# Patient Record
Sex: Female | Born: 1935
Health system: Southern US, Community
[De-identification: ages and names within clinical notes are randomized; demographics above are authoritative.]

## PROBLEM LIST (undated history)

## (undated) DIAGNOSIS — M1990 Unspecified osteoarthritis, unspecified site: Secondary | ICD-10-CM

## (undated) DIAGNOSIS — E559 Vitamin D deficiency, unspecified: Secondary | ICD-10-CM

## (undated) DIAGNOSIS — F419 Anxiety disorder, unspecified: Secondary | ICD-10-CM

## (undated) DIAGNOSIS — K219 Gastro-esophageal reflux disease without esophagitis: Secondary | ICD-10-CM

## (undated) DIAGNOSIS — I82409 Acute embolism and thrombosis of unspecified deep veins of unspecified lower extremity: Secondary | ICD-10-CM

## (undated) DIAGNOSIS — IMO0001 Reserved for inherently not codable concepts without codable children: Secondary | ICD-10-CM

## (undated) HISTORY — DX: Acute embolism and thrombosis of unspecified deep veins of unspecified lower extremity: I82.409

## (undated) HISTORY — DX: Vitamin D deficiency, unspecified: E55.9

## (undated) HISTORY — DX: Anxiety disorder, unspecified: F41.9

## (undated) HISTORY — PX: FACIAL COSMETIC SURGERY: SHX629

## (undated) HISTORY — DX: Reserved for inherently not codable concepts without codable children: IMO0001

## (undated) HISTORY — DX: Unspecified osteoarthritis, unspecified site: M19.90

## (undated) HISTORY — DX: Gastro-esophageal reflux disease without esophagitis: K21.9

---

## 2012-08-30 ENCOUNTER — Encounter: Payer: Self-pay | Admitting: *Deleted

## 2012-08-30 DIAGNOSIS — M129 Arthropathy, unspecified: Secondary | ICD-10-CM | POA: Insufficient documentation

## 2012-08-30 DIAGNOSIS — K219 Gastro-esophageal reflux disease without esophagitis: Secondary | ICD-10-CM | POA: Insufficient documentation

## 2012-08-30 DIAGNOSIS — R5381 Other malaise: Secondary | ICD-10-CM | POA: Insufficient documentation

## 2012-08-30 DIAGNOSIS — I739 Peripheral vascular disease, unspecified: Secondary | ICD-10-CM | POA: Insufficient documentation

## 2012-08-30 DIAGNOSIS — R5383 Other fatigue: Secondary | ICD-10-CM

## 2012-08-30 DIAGNOSIS — R030 Elevated blood-pressure reading, without diagnosis of hypertension: Secondary | ICD-10-CM

## 2012-08-30 DIAGNOSIS — F411 Generalized anxiety disorder: Secondary | ICD-10-CM

## 2012-08-30 DIAGNOSIS — E538 Deficiency of other specified B group vitamins: Secondary | ICD-10-CM

## 2012-08-30 DIAGNOSIS — H9190 Unspecified hearing loss, unspecified ear: Secondary | ICD-10-CM | POA: Insufficient documentation

## 2012-08-30 DIAGNOSIS — IMO0001 Reserved for inherently not codable concepts without codable children: Secondary | ICD-10-CM | POA: Insufficient documentation

## 2012-08-31 ENCOUNTER — Encounter: Payer: Self-pay | Admitting: Cardiovascular Disease

## 2012-08-31 ENCOUNTER — Encounter: Payer: Self-pay | Admitting: *Deleted

## 2012-08-31 ENCOUNTER — Ambulatory Visit (INDEPENDENT_AMBULATORY_CARE_PROVIDER_SITE_OTHER): Payer: Medicare Other | Admitting: Cardiovascular Disease

## 2012-08-31 VITALS — BP 154/88 | HR 78 | Ht 63.0 in | Wt 183.5 lb

## 2012-08-31 DIAGNOSIS — R0602 Shortness of breath: Secondary | ICD-10-CM

## 2012-08-31 DIAGNOSIS — I10 Essential (primary) hypertension: Secondary | ICD-10-CM

## 2012-08-31 NOTE — Progress Notes (Signed)
Patient ID: Amy Atkins, female   DOB: May 26, 1931, 79 y.o.   MRN: [id number]    CARDIOLOGY CONSULT NOTE  Patient ID: Amy Atkins MRN: [id number] DOB/AGE: 1931/11/17 79 y.o.  Admit date: (Not on file) Primary Physician: Damien Fusi, MD  Reason for Consultation: shortness of breath  HPI:  Mrs. Clermont purportedly has a h/o GERD, anxiety, and white coat HTN, and presents today for the evaluation of shortness of breath. She denies the aforementioned h/o GERD and essential HTN. Her BP runs 120/60 mmHg at home, as she checks it 2-3 times weekly. She does have varicose veins and has an appointment next week to see a vein specialist.  Her husband passed away 1.5 yrs ago and she was his caregiver. They had been married for 52 years. While she was doing this, she began to eat a lot for comfort and put on considerable weight. She rarely left the home during this time.  She's been dieting and exercising for the past 2 months and has lost about 15 lbs. Since that time, her shortness of breath has improved. She's also now going to physical therapy.  She denies chest pain, palpitations, dizziness, and syncope. She used to feel exhausted while being her husband's caregiver, but now her energy levels have improved.  She takes no medications.   Review of systems complete and found to be negative unless listed above in HPI  Past Medical History: see HPI  SocHx: her husband passed away 1.5 yrs ago and she was his caregiver. They had been married for 52 years. She has 1 daughter and son-in-law who live with her, since her husband's passing. She is a Scientist, product/process development.   Family History  Problem Relation Age of Onset  . Heart disease Mother   . Hypertension Mother   . Heart disease Father   . Hypertension Father   . Stroke Sister   . Heart disease Brother   . Seizures Brother     History   Social History  . Marital Status: Widowed    Spouse Name: N/A    Number of Children: N/A  .  Years of Education: N/A   Occupational History  . Not on file.   Social History Main Topics  . Smoking status: Never Smoker   . Smokeless tobacco: Not on file  . Alcohol Use: Not on file  . Drug Use: Not on file  . Sexually Active: Not on file   Other Topics Concern  . Not on file   Social History Narrative  . No narrative on file      (Not in a hospital admission)  Physical exam Blood pressure 154/88, pulse 78, height 5\' 3"  (1.6 m), weight 183 lb 8 oz (83.235 kg). General: NAD Neck: No JVD, no thyromegaly or thyroid nodule.  Lungs: Clear to auscultation bilaterally with normal respiratory effort. CV: Nondisplaced PMI.  Heart regular S1/S2, no S3/S4, no murmur.  No peripheral edema.  No carotid bruit.  Normal pedal pulses.  Abdomen: Soft, nontender, no hepatosplenomegaly, no distention.  Skin: Intact without lesions or rashes.  Neurologic: Alert and oriented x 3.  Psych: Normal affect. Extremities: No clubbing or cyanosis. Varicosities in both legs. HEENT: Normal.   Labs:   No results found for this basename: WBC, HGB, HCT, MCV, PLT   No results found for this basename: NA, K, CL, CO2, BUN, CREATININE, CALCIUM, LABALBU, PROT, BILITOT, ALKPHOS, ALT, AST, GLUCOSE,  in the last 168 hours No results found for this basename: CKTOTAL, CKMB,  CKMBINDEX, TROPONINI    No results found for this basename: CHOL   No results found for this basename: HDL   No results found for this basename: LDLCALC   No results found for this basename: TRIG   No results found for this basename: CHOLHDL   No results found for this basename: LDLDIRECT       EKG: Sinus rhythm, rate 75 bpm, with an incomplete RBBB and an LAFB     ASSESSMENT AND PLAN:  1. Shortness of breath: based on her history, this may simply be due to cardiovascular deconditioning given the fact she was confined to her home as her husband's caregiver for so long. To be sure that we're not overlooking occult ischemic  heart disease, I will proceed with both an echocardiogram to evaluate for structural heart disease, as well as a stress echocardiogram to evaluate for inducible ischemia.  Signed: Prentice Docker, M.D., F.A.C.C. 08/31/2012, 9:25 AM

## 2012-08-31 NOTE — Patient Instructions (Addendum)
Your physician recommends that you schedule a follow-up appointment in: 4-6 weeks  Your physician has requested that you have an echocardiogram. Echocardiography is a painless test that uses sound waves to create images of your heart. It provides your doctor with information about the size and shape of your heart and how well your heart's chambers and valves are working. This procedure takes approximately one hour. There are no restrictions for this procedure.WE WILL CALL YOU WITH THE RESULTS ONCE AVAILABLE   Your physician has requested that you have a stress echocardiogram. For further information please visit [email or web address] Please follow instruction sheet as given.

## 2012-09-01 ENCOUNTER — Encounter: Payer: Self-pay | Admitting: Cardiovascular Disease

## 2012-09-23 ENCOUNTER — Other Ambulatory Visit (HOSPITAL_COMMUNITY): Payer: Medicare Other

## 2012-10-01 ENCOUNTER — Encounter (HOSPITAL_COMMUNITY): Payer: Self-pay

## 2012-10-01 ENCOUNTER — Ambulatory Visit (HOSPITAL_COMMUNITY)
Admission: RE | Admit: 2012-10-01 | Discharge: 2012-10-01 | Disposition: A | Discharge status: Home / Self Care | Payer: Medicare Other | Source: Ambulatory Visit | Attending: Cardiovascular Disease | Admitting: Cardiovascular Disease

## 2012-10-01 DIAGNOSIS — R0602 Shortness of breath: Secondary | ICD-10-CM

## 2012-10-01 DIAGNOSIS — R0609 Other forms of dyspnea: Secondary | ICD-10-CM | POA: Insufficient documentation

## 2012-10-01 DIAGNOSIS — R0989 Other specified symptoms and signs involving the circulatory and respiratory systems: Secondary | ICD-10-CM | POA: Insufficient documentation

## 2012-10-01 DIAGNOSIS — I517 Cardiomegaly: Secondary | ICD-10-CM

## 2012-10-01 DIAGNOSIS — I10 Essential (primary) hypertension: Secondary | ICD-10-CM

## 2012-10-01 NOTE — Progress Notes (Signed)
*  PRELIMINARY RESULTS* Echocardiogram 2D Echocardiogram has been performed.  Amy Atkins 10/01/2012, 9:46 AM

## 2012-10-01 NOTE — Progress Notes (Signed)
Stress Lab Nurses Notes - [facility]  Ardella Hand 10/01/2012  Reason for doing test: Dyspnea  Type of test: Stress Echo  Nurse performing test: Marlena Clipper, RN  Nuclear Medicine Tech: Not Applicable  Echo Tech: Karrie Doffing  MD performing test: [place]/ Joni Reining NP  Family MD:   Test explained and consent signed: yes  IV started: No IV started  Symptoms: hr reached slight tiredness in legs  Treatment/Intervention: None  Reason test stopped: reached target HR  After recovery IV was: No IV started  Patient to return to Nuc. Med at :NA  Patient discharged: Home  Patient's Condition upon discharge was: stable  Comments: Patient walked 5 minutes and 05 seconds in bruce protocol. Resting HR was 91 and resting BP was 184/64 and her peak HR was 139 and peak BP was 221/82. Symptoms resolved in recovery.  Angelica Pou

## 2012-10-01 NOTE — Progress Notes (Signed)
*  PRELIMINARY RESULTS* Echocardiogram Echocardiogram Stress Test has been performed.  Amy Atkins  10/01/2012, 10:46 AM

## 2012-10-05 ENCOUNTER — Encounter: Payer: Self-pay | Admitting: Cardiology

## 2012-10-05 ENCOUNTER — Ambulatory Visit (INDEPENDENT_AMBULATORY_CARE_PROVIDER_SITE_OTHER): Payer: Medicare Other | Admitting: Cardiology

## 2012-10-05 VITALS — BP 141/78 | HR 86 | Ht 63.0 in | Wt 182.1 lb

## 2012-10-05 DIAGNOSIS — R0602 Shortness of breath: Secondary | ICD-10-CM | POA: Insufficient documentation

## 2012-10-05 DIAGNOSIS — IMO0001 Reserved for inherently not codable concepts without codable children: Secondary | ICD-10-CM

## 2012-10-05 DIAGNOSIS — I10 Essential (primary) hypertension: Secondary | ICD-10-CM

## 2012-10-05 NOTE — Assessment & Plan Note (Signed)
Recent testing shows normal LV function and no evidence of ischemia. Reassuring overall. No further cardiac testing anticipated this time. I have recommended that she continue to stay active with exercise. Keep followup with primary care.

## 2012-10-05 NOTE — Progress Notes (Signed)
   Clinical Summary Ms. Borland is a 79 y.o.female presenting for office visit.  She was just recently seen by Dr. Purvis Sheffield in late July. I reviewed the previous office note. She requested to come back and see me in followup, I used to take care of her husband.  Recent exercise echocardiogram was negative for ischemia. Echocardiogram from late August showed mild LVH with LVEF 60-65%, grade 1 diastolic dysfunction, MAC with trace mitral regurgitation, trace tricuspid regurgitation. We reviewed these results today.  She states she has been trying to exercise with a treadmill, does feel better in general. Also stays active with her church. She tells me that she will be having several teeth extracted. No other major health changes. We went over her home blood pressure checks, most of which look quite good. She had one elevation with systolics in the 160s.   Allergies  Allergen Reactions  . Terbinafine And Related     Itching, rash    Current Outpatient Prescriptions  Medication Sig Dispense Refill  . Cholecalciferol (VITAMIN D PO) Take by mouth daily.      Marland Kitchen GARLIC PO Take by mouth daily.      . ST JOHNS WORT PO Take by mouth daily.       No current facility-administered medications for this visit.    Past Medical History  Diagnosis Date  . White coat hypertension   . Anxiety   . GERD (gastroesophageal reflux disease)     Social History Ms. Peragine reports that she has never smoked. She does not have any smokeless tobacco history on file. Ms. Hollinshead has no alcohol history on file.  Review of Systems No palpitations, dizziness, syncope.  Physical Examination Filed Vitals:   10/05/12 1521  BP: 141/78  Pulse: 86   Filed Weights   10/05/12 1521  Weight: 182 lb 1.3 oz (82.591 kg)   Appears comfortable at rest. HEENT: Conjunctiva and lids normal, oropharynx clear. Neck: Supple, no elevated JVP or carotid bruits, no thyromegaly. Lungs: Clear to auscultation, nonlabored  breathing at rest. Cardiac: Regular rate and rhythm, no S3 or significant systolic murmur, no pericardial rub. Abdomen: Soft, nontender, nbowel sounds present. Extremities: No pitting edema, distal pulses 2+.   Problem List and Plan   Shortness of breath Recent testing shows normal LV function and no evidence of ischemia. Reassuring overall. No further cardiac testing anticipated this time. I have recommended that she continue to stay active with exercise. Keep followup with primary care.  White coat hypertension Most blood pressure checks look good by home monitor. I asked her to keep an eye on this with Dr. Lissa Morales. No medications being added.    Jonelle Sidle, M.D., F.A.C.C.

## 2012-10-05 NOTE — Patient Instructions (Addendum)
Your physician recommends that you schedule a follow-up appointment in: AS NEEDED  

## 2012-10-05 NOTE — Assessment & Plan Note (Addendum)
Most blood pressure checks look good by home monitor. I asked her to keep an eye on this with Dr. Lissa Morales. No medications being added.

## 2012-10-06 ENCOUNTER — Ambulatory Visit: Payer: Medicare Other | Admitting: Cardiovascular Disease

## 2015-07-19 DIAGNOSIS — H35032 Hypertensive retinopathy, left eye: Secondary | ICD-10-CM | POA: Diagnosis not present

## 2015-07-19 DIAGNOSIS — H353112 Nonexudative age-related macular degeneration, right eye, intermediate dry stage: Secondary | ICD-10-CM | POA: Diagnosis not present

## 2015-07-19 DIAGNOSIS — H2512 Age-related nuclear cataract, left eye: Secondary | ICD-10-CM | POA: Diagnosis not present

## 2015-07-19 DIAGNOSIS — H40013 Open angle with borderline findings, low risk, bilateral: Secondary | ICD-10-CM | POA: Diagnosis not present

## 2015-07-19 DIAGNOSIS — H25012 Cortical age-related cataract, left eye: Secondary | ICD-10-CM | POA: Diagnosis not present

## 2015-07-19 DIAGNOSIS — H35031 Hypertensive retinopathy, right eye: Secondary | ICD-10-CM | POA: Diagnosis not present

## 2015-07-27 DIAGNOSIS — H2512 Age-related nuclear cataract, left eye: Secondary | ICD-10-CM | POA: Diagnosis not present

## 2015-07-27 DIAGNOSIS — H25012 Cortical age-related cataract, left eye: Secondary | ICD-10-CM | POA: Diagnosis not present

## 2015-11-07 DIAGNOSIS — H04123 Dry eye syndrome of bilateral lacrimal glands: Secondary | ICD-10-CM | POA: Diagnosis not present

## 2015-11-07 DIAGNOSIS — H40013 Open angle with borderline findings, low risk, bilateral: Secondary | ICD-10-CM | POA: Diagnosis not present

## 2016-01-25 ENCOUNTER — Telehealth: Payer: Self-pay

## 2016-01-25 NOTE — Telephone Encounter (Signed)
Pt had molar tooth pulled due to infection.Now has sinus pain,cough,feels feverish. I encouraged her to see dentist who pulled tooth but [place] She said she would call them and see if they would prescribe antibiotic.She was concerned BP was elevate but she is in a good deal of pain,takes no medications other than OTC products

## 2016-02-13 DIAGNOSIS — H40013 Open angle with borderline findings, low risk, bilateral: Secondary | ICD-10-CM | POA: Diagnosis not present

## 2016-08-11 DIAGNOSIS — H2513 Age-related nuclear cataract, bilateral: Secondary | ICD-10-CM | POA: Diagnosis not present

## 2016-08-11 DIAGNOSIS — H25012 Cortical age-related cataract, left eye: Secondary | ICD-10-CM | POA: Diagnosis not present

## 2016-08-11 DIAGNOSIS — H40013 Open angle with borderline findings, low risk, bilateral: Secondary | ICD-10-CM | POA: Diagnosis not present

## 2016-08-11 DIAGNOSIS — H35033 Hypertensive retinopathy, bilateral: Secondary | ICD-10-CM | POA: Diagnosis not present

## 2016-08-11 DIAGNOSIS — H2512 Age-related nuclear cataract, left eye: Secondary | ICD-10-CM | POA: Diagnosis not present

## 2016-08-11 DIAGNOSIS — H25013 Cortical age-related cataract, bilateral: Secondary | ICD-10-CM | POA: Diagnosis not present

## 2016-09-02 DIAGNOSIS — R5383 Other fatigue: Secondary | ICD-10-CM | POA: Diagnosis not present

## 2016-09-02 DIAGNOSIS — E559 Vitamin D deficiency, unspecified: Secondary | ICD-10-CM | POA: Diagnosis not present

## 2016-09-02 DIAGNOSIS — E669 Obesity, unspecified: Secondary | ICD-10-CM | POA: Diagnosis not present

## 2016-09-02 DIAGNOSIS — M545 Low back pain: Secondary | ICD-10-CM | POA: Diagnosis not present

## 2016-09-11 ENCOUNTER — Other Ambulatory Visit (HOSPITAL_COMMUNITY): Payer: Self-pay | Admitting: Internal Medicine

## 2016-09-11 ENCOUNTER — Ambulatory Visit (HOSPITAL_COMMUNITY)
Admission: RE | Admit: 2016-09-11 | Discharge: 2016-09-11 | Disposition: A | Discharge status: Home / Self Care | Payer: Medicare Other | Source: Ambulatory Visit | Attending: Internal Medicine | Admitting: Internal Medicine

## 2016-09-11 DIAGNOSIS — R52 Pain, unspecified: Secondary | ICD-10-CM

## 2016-09-11 DIAGNOSIS — I10 Essential (primary) hypertension: Secondary | ICD-10-CM | POA: Diagnosis not present

## 2016-09-11 DIAGNOSIS — E559 Vitamin D deficiency, unspecified: Secondary | ICD-10-CM | POA: Diagnosis not present

## 2016-09-11 DIAGNOSIS — M8938 Hypertrophy of bone, other site: Secondary | ICD-10-CM | POA: Insufficient documentation

## 2016-09-11 DIAGNOSIS — M545 Low back pain: Secondary | ICD-10-CM | POA: Insufficient documentation

## 2016-09-11 DIAGNOSIS — M5136 Other intervertebral disc degeneration, lumbar region: Secondary | ICD-10-CM | POA: Diagnosis not present

## 2016-09-11 DIAGNOSIS — M5135 Other intervertebral disc degeneration, thoracolumbar region: Secondary | ICD-10-CM | POA: Diagnosis not present

## 2016-09-11 DIAGNOSIS — M79606 Pain in leg, unspecified: Secondary | ICD-10-CM | POA: Diagnosis not present

## 2016-09-11 DIAGNOSIS — M79605 Pain in left leg: Secondary | ICD-10-CM

## 2016-09-15 DIAGNOSIS — Z6833 Body mass index (BMI) 33.0-33.9, adult: Secondary | ICD-10-CM | POA: Diagnosis not present

## 2016-09-17 ENCOUNTER — Ambulatory Visit (HOSPITAL_COMMUNITY)
Admission: RE | Admit: 2016-09-17 | Discharge: 2016-09-17 | Disposition: A | Discharge status: Home / Self Care | Payer: Medicare Other | Source: Ambulatory Visit | Attending: Internal Medicine | Admitting: Internal Medicine

## 2016-09-17 DIAGNOSIS — M7989 Other specified soft tissue disorders: Secondary | ICD-10-CM | POA: Diagnosis not present

## 2016-09-17 DIAGNOSIS — M79662 Pain in left lower leg: Secondary | ICD-10-CM | POA: Diagnosis not present

## 2016-09-17 DIAGNOSIS — M79605 Pain in left leg: Secondary | ICD-10-CM | POA: Diagnosis not present

## 2016-09-22 DIAGNOSIS — Z6832 Body mass index (BMI) 32.0-32.9, adult: Secondary | ICD-10-CM | POA: Diagnosis not present

## 2016-09-25 DIAGNOSIS — I10 Essential (primary) hypertension: Secondary | ICD-10-CM | POA: Diagnosis not present

## 2016-09-30 DIAGNOSIS — H25812 Combined forms of age-related cataract, left eye: Secondary | ICD-10-CM | POA: Diagnosis not present

## 2016-09-30 DIAGNOSIS — H2512 Age-related nuclear cataract, left eye: Secondary | ICD-10-CM | POA: Diagnosis not present

## 2016-10-09 DIAGNOSIS — I10 Essential (primary) hypertension: Secondary | ICD-10-CM | POA: Diagnosis not present

## 2016-10-16 DIAGNOSIS — H2511 Age-related nuclear cataract, right eye: Secondary | ICD-10-CM | POA: Diagnosis not present

## 2016-10-16 DIAGNOSIS — H25011 Cortical age-related cataract, right eye: Secondary | ICD-10-CM | POA: Diagnosis not present

## 2016-10-28 DIAGNOSIS — H25011 Cortical age-related cataract, right eye: Secondary | ICD-10-CM | POA: Diagnosis not present

## 2016-10-28 DIAGNOSIS — H2511 Age-related nuclear cataract, right eye: Secondary | ICD-10-CM | POA: Diagnosis not present

## 2016-11-10 DIAGNOSIS — I10 Essential (primary) hypertension: Secondary | ICD-10-CM | POA: Diagnosis not present

## 2016-12-01 DIAGNOSIS — Z6833 Body mass index (BMI) 33.0-33.9, adult: Secondary | ICD-10-CM | POA: Diagnosis not present

## 2016-12-01 DIAGNOSIS — I10 Essential (primary) hypertension: Secondary | ICD-10-CM | POA: Diagnosis not present

## 2016-12-01 DIAGNOSIS — E559 Vitamin D deficiency, unspecified: Secondary | ICD-10-CM | POA: Diagnosis not present

## 2017-01-22 DIAGNOSIS — I10 Essential (primary) hypertension: Secondary | ICD-10-CM | POA: Diagnosis not present

## 2017-01-22 DIAGNOSIS — E559 Vitamin D deficiency, unspecified: Secondary | ICD-10-CM | POA: Diagnosis not present

## 2017-03-12 DIAGNOSIS — H04123 Dry eye syndrome of bilateral lacrimal glands: Secondary | ICD-10-CM | POA: Diagnosis not present

## 2017-03-12 DIAGNOSIS — H40043 Steroid responder, bilateral: Secondary | ICD-10-CM | POA: Diagnosis not present

## 2017-03-12 DIAGNOSIS — H40013 Open angle with borderline findings, low risk, bilateral: Secondary | ICD-10-CM | POA: Diagnosis not present

## 2017-03-12 DIAGNOSIS — H02422 Myogenic ptosis of left eyelid: Secondary | ICD-10-CM | POA: Diagnosis not present

## 2017-04-30 DIAGNOSIS — E559 Vitamin D deficiency, unspecified: Secondary | ICD-10-CM | POA: Diagnosis not present

## 2017-04-30 DIAGNOSIS — I10 Essential (primary) hypertension: Secondary | ICD-10-CM | POA: Diagnosis not present

## 2017-04-30 DIAGNOSIS — I8390 Asymptomatic varicose veins of unspecified lower extremity: Secondary | ICD-10-CM | POA: Diagnosis not present

## 2017-04-30 DIAGNOSIS — R5383 Other fatigue: Secondary | ICD-10-CM | POA: Diagnosis not present

## 2017-04-30 DIAGNOSIS — E669 Obesity, unspecified: Secondary | ICD-10-CM | POA: Diagnosis not present

## 2017-05-13 DIAGNOSIS — H35033 Hypertensive retinopathy, bilateral: Secondary | ICD-10-CM | POA: Diagnosis not present

## 2017-05-13 DIAGNOSIS — H26493 Other secondary cataract, bilateral: Secondary | ICD-10-CM | POA: Diagnosis not present

## 2017-05-13 DIAGNOSIS — H353112 Nonexudative age-related macular degeneration, right eye, intermediate dry stage: Secondary | ICD-10-CM | POA: Diagnosis not present

## 2017-05-13 DIAGNOSIS — H40013 Open angle with borderline findings, low risk, bilateral: Secondary | ICD-10-CM | POA: Diagnosis not present

## 2017-07-01 DIAGNOSIS — I8311 Varicose veins of right lower extremity with inflammation: Secondary | ICD-10-CM | POA: Diagnosis not present

## 2017-07-01 DIAGNOSIS — I8312 Varicose veins of left lower extremity with inflammation: Secondary | ICD-10-CM | POA: Diagnosis not present

## 2017-07-02 DIAGNOSIS — I10 Essential (primary) hypertension: Secondary | ICD-10-CM | POA: Diagnosis not present

## 2017-07-02 DIAGNOSIS — I8390 Asymptomatic varicose veins of unspecified lower extremity: Secondary | ICD-10-CM | POA: Diagnosis not present

## 2017-08-20 DIAGNOSIS — I8312 Varicose veins of left lower extremity with inflammation: Secondary | ICD-10-CM | POA: Diagnosis not present

## 2017-08-26 DIAGNOSIS — I8311 Varicose veins of right lower extremity with inflammation: Secondary | ICD-10-CM | POA: Diagnosis not present

## 2017-09-16 DIAGNOSIS — R600 Localized edema: Secondary | ICD-10-CM | POA: Diagnosis not present

## 2017-09-16 DIAGNOSIS — I8312 Varicose veins of left lower extremity with inflammation: Secondary | ICD-10-CM | POA: Diagnosis not present

## 2017-09-16 DIAGNOSIS — I8311 Varicose veins of right lower extremity with inflammation: Secondary | ICD-10-CM | POA: Diagnosis not present

## 2017-10-13 DIAGNOSIS — I10 Essential (primary) hypertension: Secondary | ICD-10-CM | POA: Diagnosis not present

## 2017-10-13 DIAGNOSIS — E669 Obesity, unspecified: Secondary | ICD-10-CM | POA: Diagnosis not present

## 2017-10-13 DIAGNOSIS — M25579 Pain in unspecified ankle and joints of unspecified foot: Secondary | ICD-10-CM | POA: Diagnosis not present

## 2017-11-13 DIAGNOSIS — I8312 Varicose veins of left lower extremity with inflammation: Secondary | ICD-10-CM | POA: Diagnosis not present

## 2017-12-08 DIAGNOSIS — R0683 Snoring: Secondary | ICD-10-CM | POA: Diagnosis not present

## 2017-12-08 DIAGNOSIS — I10 Essential (primary) hypertension: Secondary | ICD-10-CM | POA: Diagnosis not present

## 2017-12-08 DIAGNOSIS — R399 Unspecified symptoms and signs involving the genitourinary system: Secondary | ICD-10-CM | POA: Diagnosis not present

## 2017-12-08 DIAGNOSIS — R0600 Dyspnea, unspecified: Secondary | ICD-10-CM | POA: Diagnosis not present

## 2017-12-16 DIAGNOSIS — I8312 Varicose veins of left lower extremity with inflammation: Secondary | ICD-10-CM | POA: Diagnosis not present

## 2017-12-18 DIAGNOSIS — I8312 Varicose veins of left lower extremity with inflammation: Secondary | ICD-10-CM | POA: Diagnosis not present

## 2017-12-22 DIAGNOSIS — I8312 Varicose veins of left lower extremity with inflammation: Secondary | ICD-10-CM | POA: Diagnosis not present

## 2018-01-05 DIAGNOSIS — I8312 Varicose veins of left lower extremity with inflammation: Secondary | ICD-10-CM | POA: Diagnosis not present

## 2018-01-14 DIAGNOSIS — M79606 Pain in leg, unspecified: Secondary | ICD-10-CM | POA: Diagnosis not present

## 2018-01-14 DIAGNOSIS — R030 Elevated blood-pressure reading, without diagnosis of hypertension: Secondary | ICD-10-CM | POA: Diagnosis not present

## 2018-01-14 DIAGNOSIS — R399 Unspecified symptoms and signs involving the genitourinary system: Secondary | ICD-10-CM | POA: Diagnosis not present

## 2018-01-14 DIAGNOSIS — R0683 Snoring: Secondary | ICD-10-CM | POA: Diagnosis not present

## 2018-01-20 DIAGNOSIS — I8311 Varicose veins of right lower extremity with inflammation: Secondary | ICD-10-CM | POA: Diagnosis not present

## 2018-03-02 DIAGNOSIS — I8311 Varicose veins of right lower extremity with inflammation: Secondary | ICD-10-CM | POA: Diagnosis not present

## 2018-03-04 DIAGNOSIS — I8311 Varicose veins of right lower extremity with inflammation: Secondary | ICD-10-CM | POA: Diagnosis not present

## 2018-04-15 DIAGNOSIS — H04123 Dry eye syndrome of bilateral lacrimal glands: Secondary | ICD-10-CM | POA: Diagnosis not present

## 2018-04-15 DIAGNOSIS — H40013 Open angle with borderline findings, low risk, bilateral: Secondary | ICD-10-CM | POA: Diagnosis not present

## 2018-04-15 DIAGNOSIS — H02422 Myogenic ptosis of left eyelid: Secondary | ICD-10-CM | POA: Diagnosis not present

## 2018-04-15 DIAGNOSIS — H40043 Steroid responder, bilateral: Secondary | ICD-10-CM | POA: Diagnosis not present

## 2018-12-04 ENCOUNTER — Encounter (INDEPENDENT_AMBULATORY_CARE_PROVIDER_SITE_OTHER): Payer: Self-pay | Admitting: Nurse Practitioner

## 2018-12-04 DIAGNOSIS — Z0000 Encounter for general adult medical examination without abnormal findings: Secondary | ICD-10-CM | POA: Insufficient documentation

## 2018-12-13 ENCOUNTER — Ambulatory Visit (INDEPENDENT_AMBULATORY_CARE_PROVIDER_SITE_OTHER): Payer: Medicare Other | Admitting: Nurse Practitioner

## 2019-03-21 ENCOUNTER — Ambulatory Visit (INDEPENDENT_AMBULATORY_CARE_PROVIDER_SITE_OTHER): Payer: Medicare Other | Admitting: Nurse Practitioner

## 2019-05-09 DIAGNOSIS — Z23 Encounter for immunization: Secondary | ICD-10-CM | POA: Diagnosis not present

## 2019-06-06 ENCOUNTER — Ambulatory Visit (INDEPENDENT_AMBULATORY_CARE_PROVIDER_SITE_OTHER): Payer: Medicare Other | Admitting: Nurse Practitioner

## 2019-06-06 DIAGNOSIS — Z23 Encounter for immunization: Secondary | ICD-10-CM | POA: Diagnosis not present

## 2019-06-06 IMAGING — US US EXTREM LOW VENOUS*L*
1 series · 13 of 24 positions shown · non-contrast
Comparison: None.

CLINICAL DATA: Left lower extremity pain and swelling for the past
2 months. Evaluate for DVT.



[Series 1: us extrem low venous*left* · 0.08mm/px · 13 of 36 slices shown]
[im 1/36]
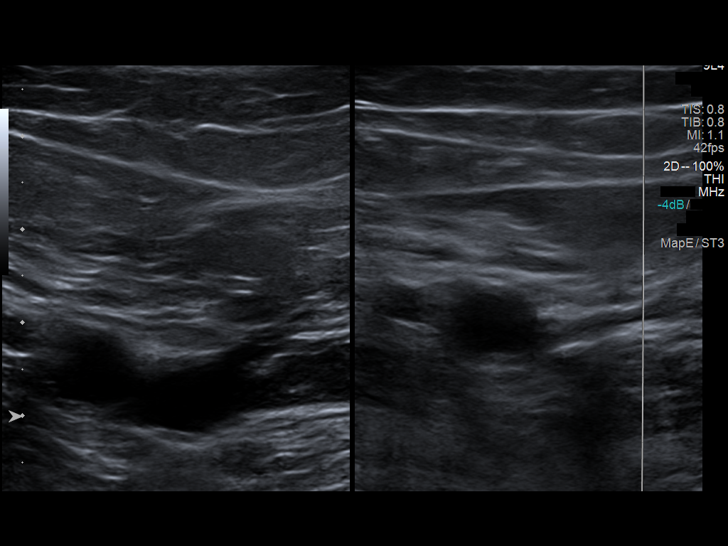
[im 4/36]
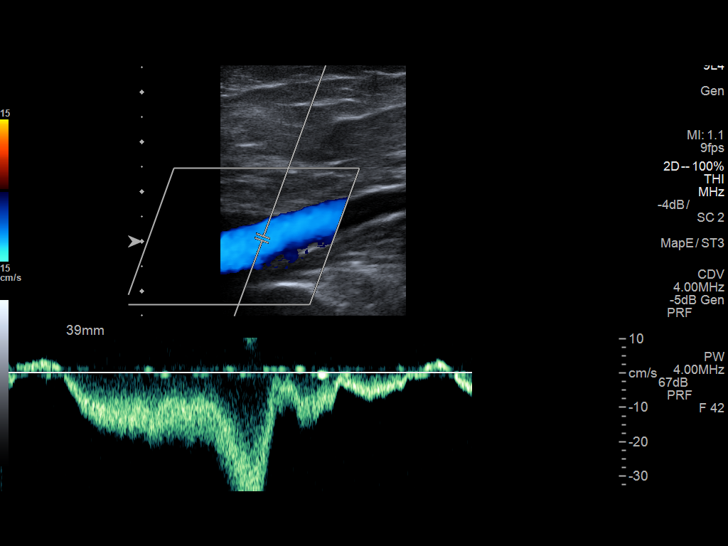
[im 7/36]
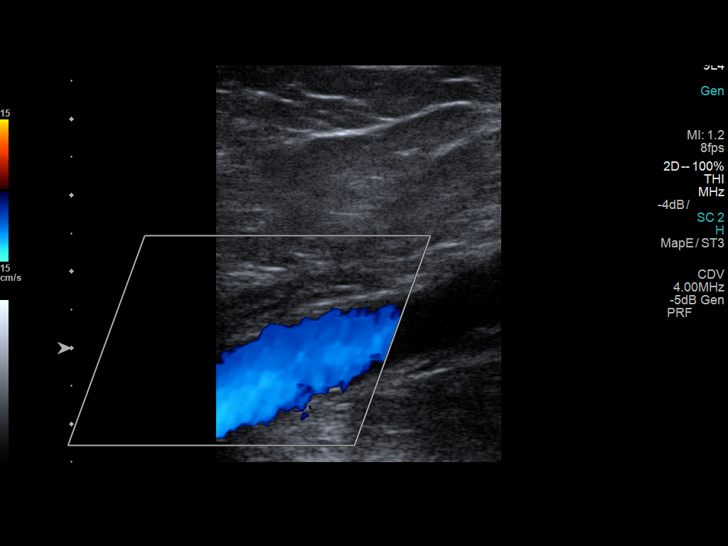
[im 10/36]
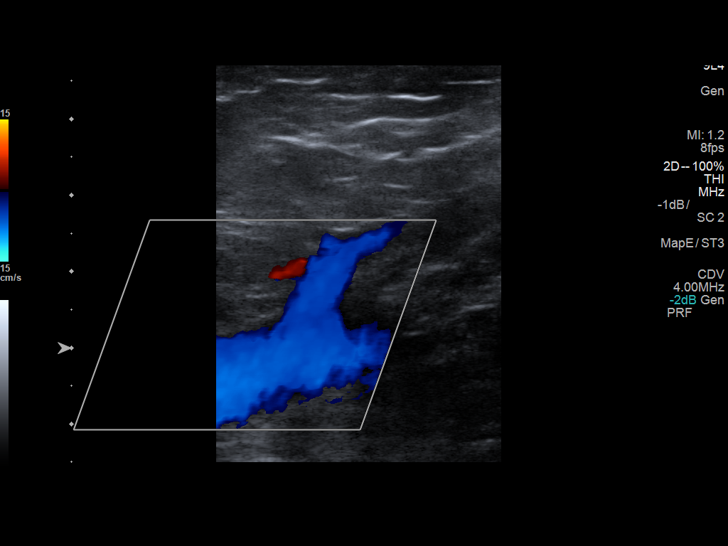
[im 13/36]
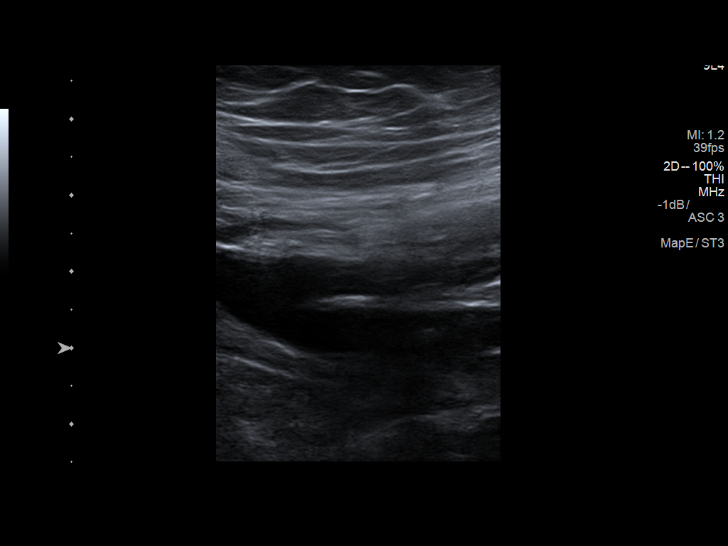
[im 16/36]
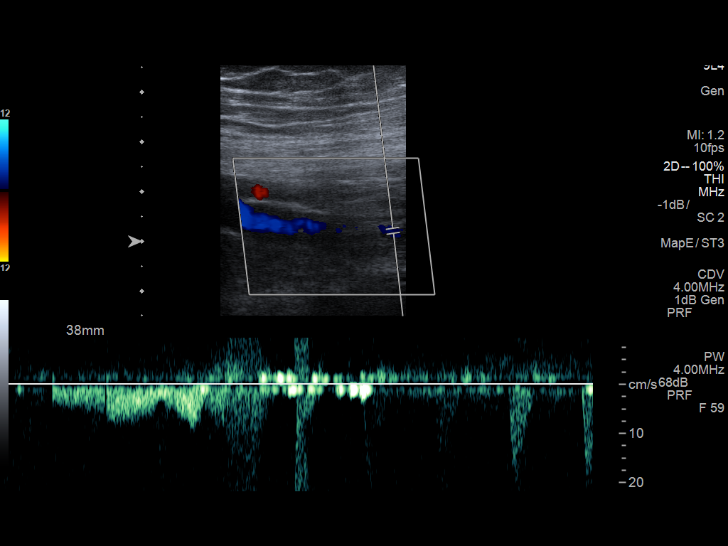
[im 19/36]
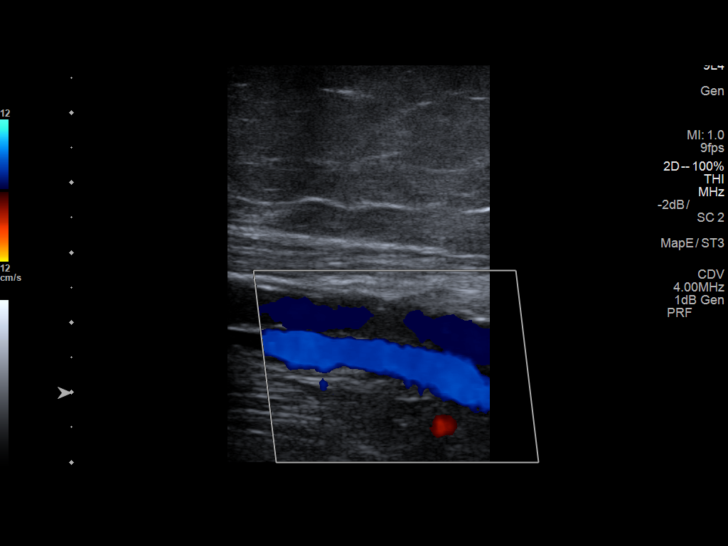
[im 20/36]
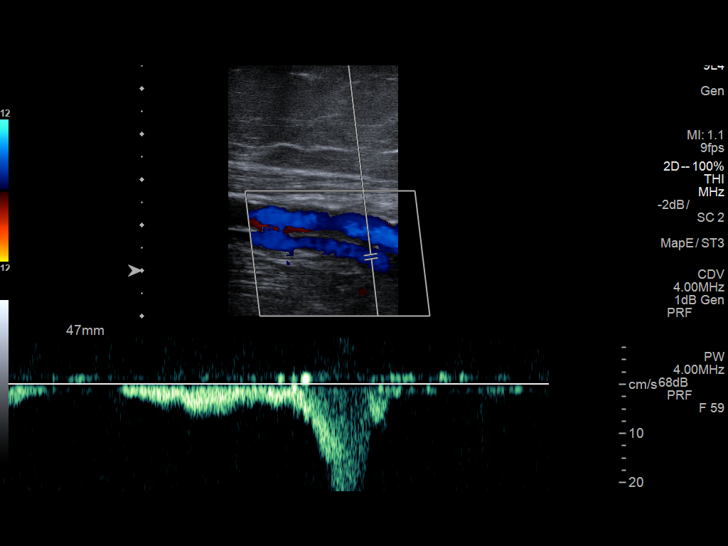
[im 23/36]
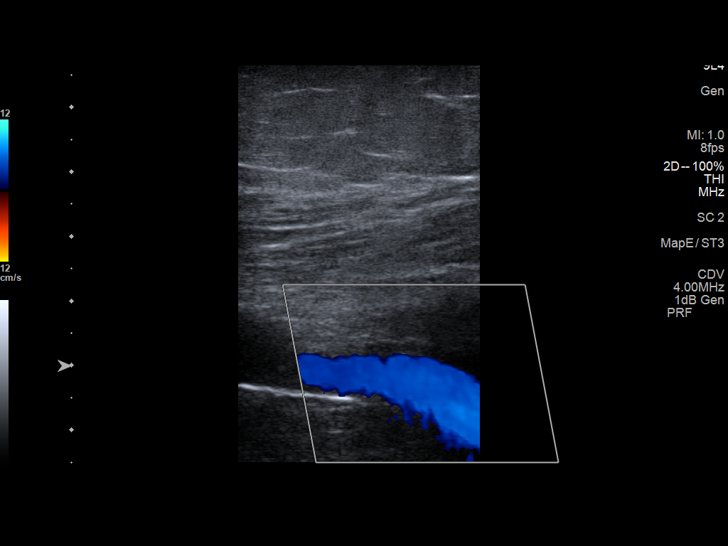
[im 26/36]
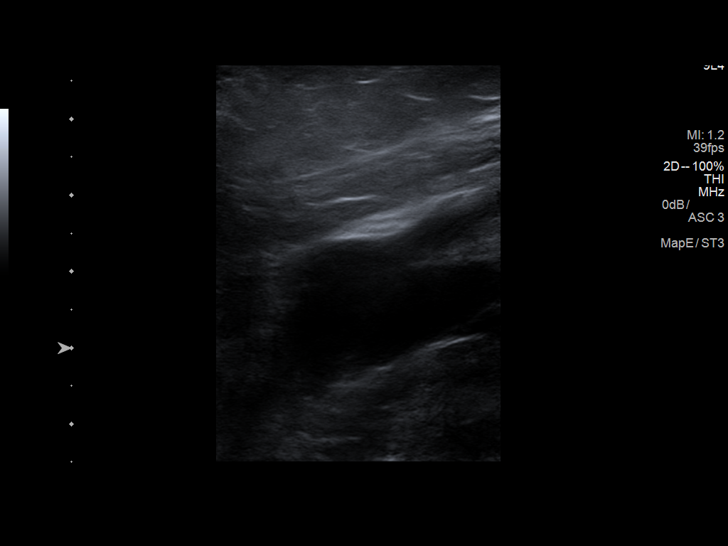
[im 29/36]
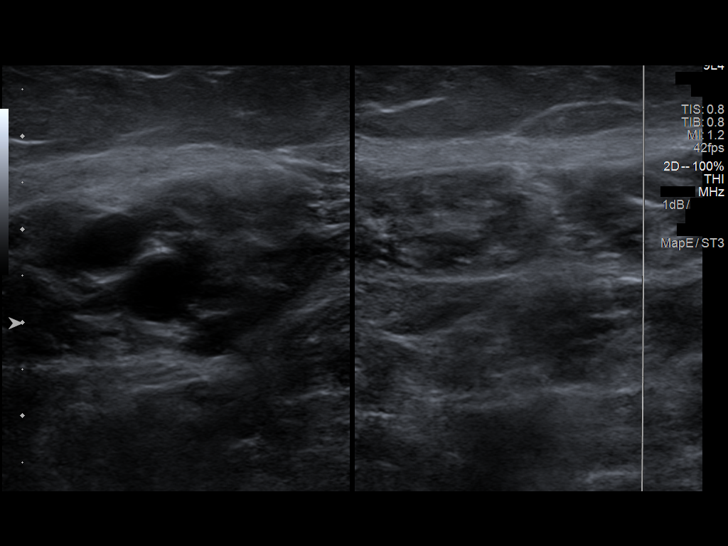
[im 32/36]
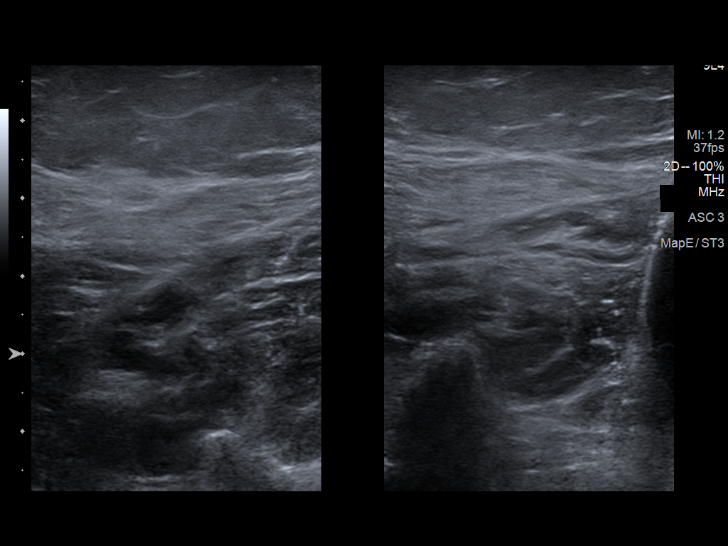
[im 36/36]
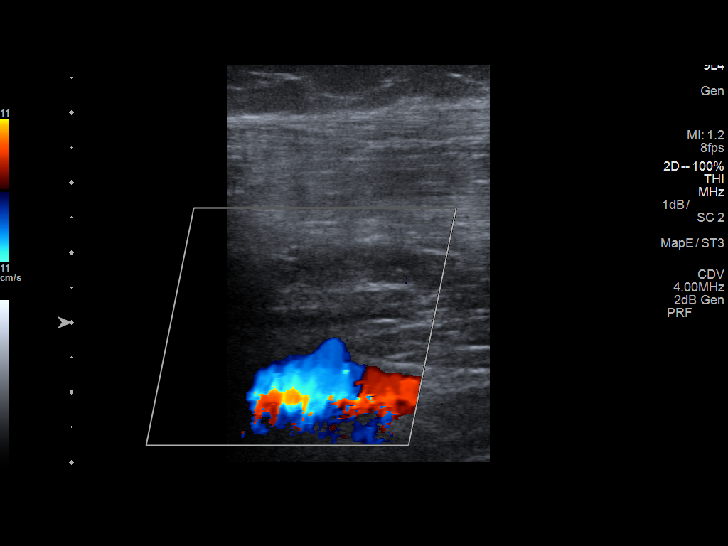

[13 of 24 positions shown; findings below may reference images not displayed]

FINDINGS: Contralateral Common Femoral Vein: Respiratory phasicity is normal
and symmetric with the symptomatic side. No evidence of thrombus.
Normal compressibility.

Common Femoral Vein: No evidence of thrombus. Normal
compressibility, respiratory phasicity and response to augmentation.

Saphenofemoral Junction: No evidence of thrombus. Normal
compressibility and flow on color Doppler imaging.

Profunda Femoral Vein: No evidence of thrombus. Normal
compressibility and flow on color Doppler imaging.

Femoral Vein: No evidence of thrombus. Normal compressibility,
respiratory phasicity and response to augmentation.

Popliteal Vein: No evidence of thrombus. Normal compressibility,
respiratory phasicity and response to augmentation.

Calf Veins: No evidence of thrombus. Normal compressibility and flow
on color Doppler imaging.

Superficial Great Saphenous Vein: No evidence of thrombus. Normal
compressibility and flow on color Doppler imaging.

Venous Reflux:  None.

Other Findings:  None.
IMPRESSION: No evidence of DVT within the left lower extremity.

## 2019-06-09 ENCOUNTER — Encounter (INDEPENDENT_AMBULATORY_CARE_PROVIDER_SITE_OTHER): Payer: Self-pay

## 2020-01-13 DIAGNOSIS — Z23 Encounter for immunization: Secondary | ICD-10-CM | POA: Diagnosis not present

## 2020-05-17 ENCOUNTER — Telehealth (INDEPENDENT_AMBULATORY_CARE_PROVIDER_SITE_OTHER): Payer: Self-pay

## 2020-05-17 ENCOUNTER — Telehealth (INDEPENDENT_AMBULATORY_CARE_PROVIDER_SITE_OTHER): Payer: Self-pay | Admitting: Nurse Practitioner

## 2020-05-17 DIAGNOSIS — I824Z2 Acute embolism and thrombosis of unspecified deep veins of left distal lower extremity: Secondary | ICD-10-CM | POA: Diagnosis not present

## 2020-05-17 DIAGNOSIS — I82412 Acute embolism and thrombosis of left femoral vein: Secondary | ICD-10-CM | POA: Diagnosis not present

## 2020-05-17 NOTE — Telephone Encounter (Signed)
Please call this patient to schedule a patient for follow-up appointment regarding her recent diagnosis of DVT with vascular specialist. This an be with either myself or Dr. Donnamarie Rossetti see if we can get her in within the next couple of weeks.

## 2020-05-17 NOTE — Telephone Encounter (Signed)
Patient has an appointment with [place] scheduled on 05/24/2020.

## 2020-05-17 NOTE — Telephone Encounter (Signed)
Thank you :)

## 2020-05-17 NOTE — Telephone Encounter (Signed)
Dr. Karilyn Cota, just as FYI this patient's vein specialst Dr. Otho Najjar called me today to let me know that they found a pretty large DVT while seeing her in the office today. She has not been to see [place] in about 3 years. He wanted to make sure she gets reestablished with [place] for primary care in case there may be any underlying issues that predispose her to DVT such as undiagnosed malignancy. She is scheduled to see you on 05/24/20. Dr. Otho Najjar tells me that he will treat her for the DVT and has initiated her on eliquis.

## 2020-05-17 NOTE — Telephone Encounter (Signed)
This was open to assist Dr Guss Bunde to review some things w/ Jiles Prows, NP-C to discuss  findings on Dr Karilyn Cota absence.

## 2020-05-22 ENCOUNTER — Telehealth (INDEPENDENT_AMBULATORY_CARE_PROVIDER_SITE_OTHER): Payer: Self-pay

## 2020-05-23 NOTE — Telephone Encounter (Signed)
Appt was not canceled.

## 2020-05-24 ENCOUNTER — Ambulatory Visit (INDEPENDENT_AMBULATORY_CARE_PROVIDER_SITE_OTHER): Payer: Medicare Other | Admitting: Internal Medicine

## 2020-05-30 ENCOUNTER — Other Ambulatory Visit: Payer: Self-pay

## 2020-05-30 ENCOUNTER — Encounter (INDEPENDENT_AMBULATORY_CARE_PROVIDER_SITE_OTHER): Payer: Self-pay | Admitting: Internal Medicine

## 2020-05-30 ENCOUNTER — Ambulatory Visit (INDEPENDENT_AMBULATORY_CARE_PROVIDER_SITE_OTHER): Payer: Medicare Other | Admitting: Internal Medicine

## 2020-05-30 ENCOUNTER — Telehealth (INDEPENDENT_AMBULATORY_CARE_PROVIDER_SITE_OTHER): Payer: Self-pay

## 2020-05-30 VITALS — BP 122/80 | HR 95 | Temp 97.6°F | Resp 18 | Ht 63.0 in | Wt 191.6 lb

## 2020-05-30 DIAGNOSIS — I82412 Acute embolism and thrombosis of left femoral vein: Secondary | ICD-10-CM | POA: Diagnosis not present

## 2020-05-30 DIAGNOSIS — R9389 Abnormal findings on diagnostic imaging of other specified body structures: Secondary | ICD-10-CM

## 2020-05-30 DIAGNOSIS — H9193 Unspecified hearing loss, bilateral: Secondary | ICD-10-CM | POA: Diagnosis not present

## 2020-05-30 DIAGNOSIS — R0600 Dyspnea, unspecified: Secondary | ICD-10-CM | POA: Diagnosis not present

## 2020-05-30 LAB — COMPLETE METABOLIC PANEL WITH GFR
AG Ratio: 1.7 (calc) (ref 1.0–2.5)
ALT: 13 U/L (ref 6–29)
AST: 13 U/L (ref 10–35)
Albumin: 4.5 g/dL (ref 3.6–5.1)
Alkaline phosphatase (APISO): 69 U/L (ref 37–153)
BUN/Creatinine Ratio: 24 (calc) — ABNORMAL HIGH (ref 6–22)
BUN: 25 mg/dL (ref 7–25)
CO2: 26 mmol/L (ref 20–32)
Calcium: 10.4 mg/dL (ref 8.6–10.4)
Chloride: 105 mmol/L (ref 98–110)
Creat: 1.05 mg/dL — ABNORMAL HIGH (ref 0.60–0.88)
GFR, Est African American: 55 mL/min/{1.73_m2} — ABNORMAL LOW (ref >=60)
GFR, Est Non African American: 48 mL/min/{1.73_m2} — ABNORMAL LOW (ref >=60)
Globulin: 2.6 g/dL (calc) (ref 1.9–3.7)
Glucose, Bld: 96 mg/dL (ref 65–139)
Potassium: 4.7 mmol/L (ref 3.5–5.3)
Sodium: 140 mmol/L (ref 135–146)
Total Bilirubin: 0.5 mg/dL (ref 0.2–1.2)
Total Protein: 7.1 g/dL (ref 6.1–8.1)

## 2020-05-30 LAB — CBC
HCT: 42.2 % (ref 35.0–45.0)
Hemoglobin: 13.6 g/dL (ref 11.7–15.5)
MCH: 28.8 pg (ref 27.0–33.0)
MCHC: 32.2 g/dL (ref 32.0–36.0)
MCV: 89.2 fL (ref 80.0–100.0)
MPV: 11.9 fL (ref 7.5–12.5)
Platelets: 221 10*3/uL (ref 140–400)
RBC: 4.73 10*6/uL (ref 3.80–5.10)
RDW: 13.1 % (ref 11.0–15.0)
WBC: 8.1 10*3/uL (ref 3.8–10.8)
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
## 2020-05-30 NOTE — Telephone Encounter (Signed)
Please call patient regarding the CT scan.  Thanks.

## 2020-05-30 NOTE — Telephone Encounter (Signed)
Yes, I CALLED HER SO WILL NEED TO RESCHEDULE WITH CENTRAL SCHEDULE SO SHE CAN DO EITHER ON THE MAY 9TH OR 13TH. TO GIVE DTR VANESSA TIME TO HAVE OFF.

## 2020-05-30 NOTE — Telephone Encounter (Signed)
Patient has called a few times today to discuss the CT scan with you.  Please call patient back at [phone].

## 2020-06-04 ENCOUNTER — Ambulatory Visit (HOSPITAL_BASED_OUTPATIENT_CLINIC_OR_DEPARTMENT_OTHER): Payer: Medicare Other

## 2020-06-04 ENCOUNTER — Telehealth (INDEPENDENT_AMBULATORY_CARE_PROVIDER_SITE_OTHER): Payer: Self-pay

## 2020-06-04 NOTE — Telephone Encounter (Signed)
Patient called and stated that she was not aware of the CT Scan that was scheduled for today and was really concerned about using the contrast as she knows that this can affect her heart, etc. Patient wanted to know if the benefits outweigh the risks? Also patient wants to get hearing aids before she has the CT scan done because she has a hard time hearing.   Patient is going to have her daughter call back to schedule her follow up sooner than 08/01/2020 because patient feels like she was not able to hear Dr. Karilyn Cota well due to her hearing.    Dr. Karilyn Cota, patient would also like to know the results of her labs. Please advise.

## 2020-06-04 NOTE — Telephone Encounter (Signed)
Please call the patient and let her know that her kidney function is slightly abnormal and she needs to drink plenty of water every day. It is important that she has the CT scan done because we need to find out if anything serious causing her blood clot is present.  If she does not get the CT scan done, she would be going against my medical advice and we will have to document this in the chart.  If she was not able to do the CT scan today, please reschedule it for another day soon.  Thanks.
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
## 2020-06-06 DIAGNOSIS — I824Z2 Acute embolism and thrombosis of unspecified deep veins of left distal lower extremity: Secondary | ICD-10-CM | POA: Diagnosis not present

## 2020-08-01 ENCOUNTER — Other Ambulatory Visit: Payer: Self-pay

## 2020-08-01 ENCOUNTER — Ambulatory Visit (INDEPENDENT_AMBULATORY_CARE_PROVIDER_SITE_OTHER): Payer: Medicare Other | Admitting: Nurse Practitioner

## 2020-08-01 VITALS — BP 133/67 | HR 68 | Temp 97.3°F | Resp 18 | Ht 63.0 in | Wt 198.2 lb

## 2020-08-01 DIAGNOSIS — I82412 Acute embolism and thrombosis of left femoral vein: Secondary | ICD-10-CM

## 2020-08-01 NOTE — Progress Notes (Signed)
Subjective:  Patient ID: Amy Atkins, female    DOB: 1931-04-29  Age: 87 y.o. MRN: [id number]  CC:  Chief Complaint  Patient presents with   Other    DVT      HPI  This patient arrives today for the above.  She was diagnosed with DVT approximately 2 months ago by vein and vascular specialist.  She is on Eliquis and is tolerating his medication well she has not seen any signs of bleeding.  Dr. Karilyn Cota did order CT scan of her abdomen and pelvis to look for any signs of possible malignancy that could have predisposed her to developing a blood clot but she never went and had the CT scan completed.  She tells me she does not feel that its necessary to have done and does not want to take the risk of causing harm to her kidneys due to the IV contrast exposure.  She is following with her vein and vascular specialist and sees them later this week.  She denies any chest pain or shortness of breath.  Past Medical History:  Diagnosis Date   Anxiety    Arthritis    GERD (gastroesophageal reflux disease)    Vitamin D deficiency    White coat hypertension       Family History  Problem Relation Age of Onset   Heart disease Mother    Hypertension Mother    Dementia Mother    Heart disease Father    Hypertension Father    Multiple sclerosis Father    Stroke Sister    Heart disease Brother    Seizures Brother     Social History   Social History Narrative   Not on file   Social History   Tobacco Use   Smoking status: Never   Smokeless tobacco: Never  Substance Use Topics   Alcohol use: Not on file     Current Meds  Medication Sig   ELIQUIS 5 MG TABS tablet Take 1 tablet by mouth in the morning and at bedtime. Take 1 tablet by mouth twice day for a week; then 1/2 tablet by mouth twice day.    ROS:  Review of Systems  Constitutional:  Positive for malaise/fatigue (at baseline). Negative for weight loss.  Respiratory:  Positive for shortness of breath (with  exertion).   Cardiovascular:  Negative for chest pain and leg swelling.  Gastrointestinal:  Negative for abdominal pain and blood in stool.    Objective:   Today's Vitals: BP 133/67 (BP Location: Right Arm, Patient Position: Sitting, Cuff Size: Normal)   Pulse 68   Temp (!) 97.3 F (36.3 C) (Temporal)   Resp 18   Ht 5\' 3"  (1.6 m)   Wt 198 lb 3.2 oz (89.9 kg)   BMI 35.11 kg/m  Vitals with BMI 08/01/2020 05/30/2020 10/05/2012  Height 5\' 3"  5\' 3"  5\' 3"   Weight 198 lbs 3 oz 191 lbs 10 oz 182 lbs 1 oz  BMI 35.12 33.95 32.3  Systolic 133 122 12/05/2012  Diastolic 67 80 78  Pulse 68 95 86     Physical Exam Vitals reviewed.  Constitutional:      General: She is not in acute distress.    Appearance: Normal appearance.  HENT:     Head: Normocephalic and atraumatic.  Neck:     Vascular: No carotid bruit.  Cardiovascular:     Rate and Rhythm: Normal rate and regular rhythm.     Pulses: Normal pulses.  Heart sounds: Normal heart sounds.  Pulmonary:     Effort: Pulmonary effort is normal.     Breath sounds: Normal breath sounds.  Skin:    General: Skin is warm and dry.  Neurological:     General: No focal deficit present.     Mental Status: She is alert and oriented to person, place, and time.  Psychiatric:        Mood and Affect: Mood normal.        Behavior: Behavior normal.        Judgment: Judgment normal.         Assessment and Plan   1. DVT femoral (deep venous thrombosis) with thrombophlebitis, left (HCC)      Plan: 1.  She will follow-up with her beta vascular specialist as scheduled.  We did have a long discussion regarding the risks versus benefits of doing a CT scan and the reasons why the scan was ordered in the first place.  We did go over red flag signs and symptoms of malignancy.  She has determined that she you still would prefer to hold off on CT scan but does tell me if she experiences any worsening fatigue, worsening back pain, unintentional weight loss,  blood in stool or abdominal pain but she will notify me right away and we will move forward with a CT scan at that point.  She is aware that the risk of not doing the CT scan means that there may be an undiagnosed malignancy that contributed to the DVT in the first place and having this undiagnosed can result in poor outcomes.  She will follow-up for chronic visit in 3 months or sooner as needed.  She was encouraged to call my office with any questions or concerns especially if she experiences any changes to her health as discussed above between now her next appointment.   Tests ordered No orders of the defined types were placed in this encounter.     No orders of the defined types were placed in this encounter.   Patient to follow-up in 3 months or sooner as needed.  Elenore Paddy, NP

## 2020-08-08 DIAGNOSIS — I8312 Varicose veins of left lower extremity with inflammation: Secondary | ICD-10-CM | POA: Diagnosis not present

## 2020-09-19 DIAGNOSIS — I83892 Varicose veins of left lower extremities with other complications: Secondary | ICD-10-CM | POA: Diagnosis not present

## 2020-09-19 DIAGNOSIS — I8312 Varicose veins of left lower extremity with inflammation: Secondary | ICD-10-CM | POA: Diagnosis not present

## 2020-09-19 DIAGNOSIS — I824Z2 Acute embolism and thrombosis of unspecified deep veins of left distal lower extremity: Secondary | ICD-10-CM | POA: Diagnosis not present

## 2020-09-19 DIAGNOSIS — I87392 Chronic venous hypertension (idiopathic) with other complications of left lower extremity: Secondary | ICD-10-CM | POA: Diagnosis not present

## 2020-09-24 DIAGNOSIS — H353112 Nonexudative age-related macular degeneration, right eye, intermediate dry stage: Secondary | ICD-10-CM | POA: Diagnosis not present

## 2020-09-24 DIAGNOSIS — H04221 Epiphora due to insufficient drainage, right lacrimal gland: Secondary | ICD-10-CM | POA: Diagnosis not present

## 2020-09-24 DIAGNOSIS — H40013 Open angle with borderline findings, low risk, bilateral: Secondary | ICD-10-CM | POA: Diagnosis not present

## 2020-09-24 DIAGNOSIS — H04123 Dry eye syndrome of bilateral lacrimal glands: Secondary | ICD-10-CM | POA: Diagnosis not present

## 2020-11-01 ENCOUNTER — Ambulatory Visit (INDEPENDENT_AMBULATORY_CARE_PROVIDER_SITE_OTHER): Payer: Medicare Other | Admitting: Nurse Practitioner

## 2020-12-07 DIAGNOSIS — Z23 Encounter for immunization: Secondary | ICD-10-CM | POA: Diagnosis not present

## 2020-12-24 DIAGNOSIS — M7989 Other specified soft tissue disorders: Secondary | ICD-10-CM | POA: Diagnosis not present

## 2020-12-24 DIAGNOSIS — M79605 Pain in left leg: Secondary | ICD-10-CM | POA: Diagnosis not present

## 2020-12-24 DIAGNOSIS — I87392 Chronic venous hypertension (idiopathic) with other complications of left lower extremity: Secondary | ICD-10-CM | POA: Diagnosis not present

## 2020-12-24 DIAGNOSIS — I872 Venous insufficiency (chronic) (peripheral): Secondary | ICD-10-CM | POA: Diagnosis not present

## 2020-12-31 DIAGNOSIS — H04123 Dry eye syndrome of bilateral lacrimal glands: Secondary | ICD-10-CM | POA: Diagnosis not present

## 2020-12-31 DIAGNOSIS — H353112 Nonexudative age-related macular degeneration, right eye, intermediate dry stage: Secondary | ICD-10-CM | POA: Diagnosis not present

## 2020-12-31 DIAGNOSIS — H40013 Open angle with borderline findings, low risk, bilateral: Secondary | ICD-10-CM | POA: Diagnosis not present

## 2020-12-31 DIAGNOSIS — H26493 Other secondary cataract, bilateral: Secondary | ICD-10-CM | POA: Diagnosis not present

## 2021-05-07 DIAGNOSIS — I8001 Phlebitis and thrombophlebitis of superficial vessels of right lower extremity: Secondary | ICD-10-CM | POA: Diagnosis not present

## 2021-05-07 DIAGNOSIS — I872 Venous insufficiency (chronic) (peripheral): Secondary | ICD-10-CM | POA: Diagnosis not present

## 2021-05-07 DIAGNOSIS — I87391 Chronic venous hypertension (idiopathic) with other complications of right lower extremity: Secondary | ICD-10-CM | POA: Diagnosis not present

## 2021-05-20 ENCOUNTER — Emergency Department (HOSPITAL_BASED_OUTPATIENT_CLINIC_OR_DEPARTMENT_OTHER)
Admission: EM | Admit: 2021-05-20 | Discharge: 2021-05-21 | Disposition: A | Discharge status: Home / Self Care | Payer: Medicare Other | Attending: Emergency Medicine | Admitting: Emergency Medicine

## 2021-05-20 ENCOUNTER — Other Ambulatory Visit: Payer: Self-pay

## 2021-05-20 ENCOUNTER — Encounter (HOSPITAL_BASED_OUTPATIENT_CLINIC_OR_DEPARTMENT_OTHER): Payer: Self-pay | Admitting: Emergency Medicine

## 2021-05-20 DIAGNOSIS — R109 Unspecified abdominal pain: Secondary | ICD-10-CM | POA: Diagnosis not present

## 2021-05-20 DIAGNOSIS — I10 Essential (primary) hypertension: Secondary | ICD-10-CM | POA: Diagnosis not present

## 2021-05-20 DIAGNOSIS — B029 Zoster without complications: Secondary | ICD-10-CM | POA: Insufficient documentation

## 2021-05-20 DIAGNOSIS — R1011 Right upper quadrant pain: Secondary | ICD-10-CM | POA: Diagnosis present

## 2021-05-20 DIAGNOSIS — Z7901 Long term (current) use of anticoagulants: Secondary | ICD-10-CM | POA: Insufficient documentation

## 2021-05-20 DIAGNOSIS — M549 Dorsalgia, unspecified: Secondary | ICD-10-CM | POA: Diagnosis not present

## 2021-05-20 DIAGNOSIS — R1010 Upper abdominal pain, unspecified: Secondary | ICD-10-CM | POA: Diagnosis not present

## 2021-05-20 LAB — URINALYSIS, ROUTINE W REFLEX MICROSCOPIC
Bilirubin Urine: NEGATIVE
Glucose, UA: NEGATIVE mg/dL
Ketones, ur: NEGATIVE mg/dL
Nitrite: NEGATIVE
Protein, ur: NEGATIVE mg/dL
Specific Gravity, Urine: <1.005 — ABNORMAL LOW (ref 1.005–1.030)
pH: 6 (ref 5.0–8.0)

## 2021-05-20 LAB — COMPREHENSIVE METABOLIC PANEL
ALT: 14 U/L (ref 0–44)
AST: 15 U/L (ref 15–41)
Albumin: 4.8 g/dL (ref 3.5–5.0)
Alkaline Phosphatase: 52 U/L (ref 38–126)
Anion gap: 8 (ref 5–15)
BUN: 19 mg/dL (ref 8–23)
CO2: 26 mmol/L (ref 22–32)
Calcium: 10 mg/dL (ref 8.9–10.3)
Chloride: 104 mmol/L (ref 98–111)
Creatinine, Ser: 0.97 mg/dL (ref 0.44–1.00)
GFR, Estimated: 56 mL/min — ABNORMAL LOW (ref >60)
Glucose, Bld: 97 mg/dL (ref 70–99)
Potassium: 4.1 mmol/L (ref 3.5–5.1)
Sodium: 138 mmol/L (ref 135–145)
Total Bilirubin: 0.6 mg/dL (ref 0.3–1.2)
Total Protein: 7.6 g/dL (ref 6.5–8.1)

## 2021-05-20 LAB — CBC
HCT: 41.4 % (ref 36.0–46.0)
Hemoglobin: 13.3 g/dL (ref 12.0–15.0)
MCH: 28.5 pg (ref 26.0–34.0)
MCHC: 32.1 g/dL (ref 30.0–36.0)
MCV: 88.7 fL (ref 80.0–100.0)
Platelets: 202 10*3/uL (ref 150–400)
RBC: 4.67 MIL/uL (ref 3.87–5.11)
RDW: 14.4 % (ref 11.5–15.5)
WBC: 8 10*3/uL (ref 4.0–10.5)
nRBC: 0 % (ref 0.0–0.2)

## 2021-05-20 LAB — LIPASE, BLOOD: Lipase: 19 U/L (ref 11–51)

## 2021-05-20 NOTE — ED Triage Notes (Signed)
Pt reports right sided abdominal and flank pain that started Friday. Denies N/V/D. ?

## 2021-05-21 ENCOUNTER — Telehealth: Payer: Self-pay

## 2021-05-21 DIAGNOSIS — B029 Zoster without complications: Secondary | ICD-10-CM | POA: Diagnosis not present

## 2021-05-21 MED ORDER — VALACYCLOVIR HCL 500 MG PO TABS
1000.0000 mg | ORAL_TABLET | Freq: Once | ORAL | Status: AC
  Administered 2021-05-21: 1000 mg via ORAL
  Filled 2021-05-21: qty 2

## 2021-05-21 MED ORDER — HYDROCODONE-ACETAMINOPHEN 5-325 MG PO TABS
1.0000 | ORAL_TABLET | Freq: Once | ORAL | Status: AC
  Administered 2021-05-21: 1 via ORAL
  Filled 2021-05-21: qty 1

## 2021-05-21 MED ORDER — HYDROCODONE-ACETAMINOPHEN 5-325 MG PO TABS: 1.0000 | ORAL_TABLET | Freq: Four times a day (QID) | ORAL | 0 refills | Status: DC | PRN

## 2021-05-21 MED ORDER — VALACYCLOVIR HCL 1 G PO TABS: 1000.0000 mg | ORAL_TABLET | Freq: Three times a day (TID) | ORAL | 0 refills | Status: DC

## 2021-05-21 NOTE — Telephone Encounter (Signed)
Pending are two things she can try - a steroid cream which may help with itching/pain and a lidocaine gel which is numbing.  Ok to send to pharmacy if she agrees. ?

## 2021-05-21 NOTE — ED Provider Notes (Signed)
? ?[facility]  ?Provider Note ? ?CSN: [id number] ?Arrival date & time: 05/20/21 1807 ? ?History ?Chief Complaint  ?Patient presents with  ? Abdominal Pain  ? ? ?Amy Atkins is a 88 y.o. female with prior history of DVT in LLE is no longer on Eliquis here for 3 days of RUQ pain radiating around to her R flank, comes and goes in waves, no N/V/D/C or dysuria. No fevers. No prior abdominal surgeries.  ? ? ?Home Medications ?Prior to Admission medications   ?Medication Sig Start Date End Date Taking? Authorizing Provider  ?HYDROcodone-acetaminophen (NORCO/VICODIN) 5-325 MG tablet Take 1 tablet by mouth every 6 (six) hours as needed for severe pain. 05/21/21  Yes Pollyann Savoy, MD  ?valACYclovir (VALTREX) 1000 MG tablet Take 1 tablet (1,000 mg total) by mouth 3 (three) times daily. 05/21/21  Yes Pollyann Savoy, MD  ?ELIQUIS 5 MG TABS tablet Take 1 tablet by mouth in the morning and at bedtime. Take 1 tablet by mouth twice day for a week; then 1/2 tablet by mouth twice day. 05/18/20   [provider]  ? ? ? ?Allergies    ?Terbinafine and related ? ? ?Review of Systems   ?Review of Systems ?Please see HPI for pertinent positives and negatives ? ?Physical Exam ?BP (!) 189/99   Pulse 84   Temp 97.7 ?F (36.5 ?C) (Oral)   Resp 16   SpO2 100%  ? ?Physical Exam ?Vitals and nursing note reviewed.  ?Constitutional:   ?   Appearance: Normal appearance.  ?HENT:  ?   Head: Normocephalic and atraumatic.  ?   Nose: Nose normal.  ?   Mouth/Throat:  ?   Mouth: Mucous membranes are moist.  ?Eyes:  ?   Extraocular Movements: Extraocular movements intact.  ?   Conjunctiva/sclera: Conjunctivae normal.  ?Cardiovascular:  ?   Rate and Rhythm: Normal rate.  ?Pulmonary:  ?   Effort: Pulmonary effort is normal.  ?   Breath sounds: Normal breath sounds.  ?Abdominal:  ?   General: Abdomen is flat.  ?   Palpations: Abdomen is soft.  ?   Tenderness: There is no abdominal tenderness. Negative signs  include Murphy's sign and McBurney's sign.  ?Musculoskeletal:     ?   General: No swelling. Normal range of motion.  ?   Cervical back: Neck supple.  ?Skin: ?   General: Skin is warm and dry.  ?   Findings: Rash (zoster on R abdomen wrapping around to R flank) present.  ?Neurological:  ?   General: No focal deficit present.  ?   Mental Status: She is alert.  ?Psychiatric:     ?   Mood and Affect: Mood normal.  ? ? ?ED Results / Procedures / Treatments   ?EKG ?None ? ?Procedures ?Procedures ? ?Medications Ordered in the ED ?Medications  ?HYDROcodone-acetaminophen (NORCO/VICODIN) 5-325 MG per tablet 1 tablet (has no administration in time range)  ?valACYclovir (VALTREX) tablet 1,000 mg (has no administration in time range)  ? ? ?Initial Impression and Plan ? Patient with neuropathic RUQ abdominal pain has a zoster rash as the likely cause. She had labs done in triage, normal CBC, BMP and lipase. UA with some signs of infection but given no urinary symptoms, will avoid treating. BP is high, has no history of same but does have 'white coat hypertension'. No signs of end organ damage. Will d/c with Rx for Valtrex, pain meds and PCP follow up.  ? ?ED Course  ? ?  ? ? ?  MDM Rules/Calculators/A&P ?Medical Decision Making ?Problems Addressed: ?Herpes zoster without complication: acute illness or injury ? ?Amount and/or Complexity of Data Reviewed ?Labs: ordered. Decision-making details documented in ED Course. ? ?Risk ?Prescription drug management. ? ? ? ?Final Clinical Impression(s) / ED Diagnoses ?Final diagnoses:  ?Herpes zoster without complication  ? ? ?Rx / DC Orders ?ED Discharge Orders   ? ?      Ordered  ?  HYDROcodone-acetaminophen (NORCO/VICODIN) 5-325 MG tablet  Every 6 hours PRN       ? 05/21/21 0006  ?  valACYclovir (VALTREX) 1000 MG tablet  3 times daily       ? 05/21/21 0006  ? ?  ?  ? ?  ? ?  ?Pollyann Savoy, MD ?05/21/21 0007 ? ?

## 2021-05-21 NOTE — Telephone Encounter (Signed)
Pt reports that she got sick on stomach. She was prescribed   ?HYDROcodone-acetaminophen (NORCO/VICODIN) 5-325 MG tablet ?valACYclovir (VALTREX) 1000 MG tablet ?At the ED for shingles.  ? ?Pt reports she passed out. Didn't go to the [facility] daughter was present. Everything seem to be back to normal now but want something else to treat shingles maybe a cream. ? ?Please advise in Jeralyn Ruths, NP absence.  ?

## 2021-05-22 MED ORDER — TRIAMCINOLONE ACETONIDE 0.5 % EX OINT: 1.0000 "application " | TOPICAL_OINTMENT | Freq: Two times a day (BID) | CUTANEOUS | 0 refills | Status: DC

## 2021-05-22 MED ORDER — LIDOCAINE 5 % EX OINT: 1.0000 "application " | TOPICAL_OINTMENT | CUTANEOUS | 0 refills | Status: DC | PRN

## 2021-05-22 NOTE — Telephone Encounter (Signed)
Message left for patient today. If she calls back please let me know if she would like either meds or both sent in to pharmacy. ?

## 2021-05-22 NOTE — Telephone Encounter (Signed)
Pt states she does not have itching but deep pain ? ?Pt requesting provider to make the decision whichever is best for pain ? ?Pt requesting rx sent to [facility] [address] [place], [place], [place] [address] ?

## 2021-05-22 NOTE — Telephone Encounter (Signed)
Script sent in today. 

## 2021-05-30 ENCOUNTER — Ambulatory Visit: Payer: Medicare Other | Admitting: Nurse Practitioner

## 2021-05-30 ENCOUNTER — Other Ambulatory Visit: Payer: Medicare Other

## 2021-05-30 ENCOUNTER — Ambulatory Visit (INDEPENDENT_AMBULATORY_CARE_PROVIDER_SITE_OTHER): Payer: Medicare Other | Admitting: Nurse Practitioner

## 2021-05-30 VITALS — BP 198/92 | HR 95 | Temp 98.0°F | Ht 62.21 in | Wt 188.0 lb

## 2021-05-30 DIAGNOSIS — B028 Zoster with other complications: Secondary | ICD-10-CM | POA: Diagnosis not present

## 2021-05-30 DIAGNOSIS — B029 Zoster without complications: Secondary | ICD-10-CM | POA: Insufficient documentation

## 2021-05-30 DIAGNOSIS — R030 Elevated blood-pressure reading, without diagnosis of hypertension: Secondary | ICD-10-CM

## 2021-05-30 DIAGNOSIS — R1084 Generalized abdominal pain: Secondary | ICD-10-CM | POA: Diagnosis not present

## 2021-05-30 DIAGNOSIS — K5900 Constipation, unspecified: Secondary | ICD-10-CM | POA: Diagnosis not present

## 2021-05-30 HISTORY — DX: Zoster without complications: B02.9

## 2021-05-30 LAB — URINALYSIS WITH CULTURE, IF INDICATED
Bilirubin Urine: NEGATIVE
Ketones, ur: 15 — AB
Nitrite: NEGATIVE
Specific Gravity, Urine: 1.015 (ref 1.000–1.030)
Urine Glucose: NEGATIVE
Urobilinogen, UA: 0.2 (ref 0.0–1.0)
pH: 6 (ref 5.0–8.0)

## 2021-05-30 LAB — CBC WITH DIFFERENTIAL/PLATELET
Basophils Absolute: 0 10*3/uL (ref 0.0–0.1)
Basophils Relative: 0.4 % (ref 0.0–3.0)
Eosinophils Absolute: 0.1 10*3/uL (ref 0.0–0.7)
Eosinophils Relative: 1.2 % (ref 0.0–5.0)
HCT: 42.1 % (ref 36.0–46.0)
Hemoglobin: 14.1 g/dL (ref 12.0–15.0)
Lymphocytes Relative: 20.5 % (ref 12.0–46.0)
Lymphs Abs: 2 10*3/uL (ref 0.7–4.0)
MCHC: 33.5 g/dL (ref 30.0–36.0)
MCV: 87 fl (ref 78.0–100.0)
Monocytes Absolute: 0.6 10*3/uL (ref 0.1–1.0)
Monocytes Relative: 6.2 % (ref 3.0–12.0)
Neutro Abs: 6.9 10*3/uL (ref 1.4–7.7)
Neutrophils Relative %: 71.7 % (ref 43.0–77.0)
Platelets: 217 10*3/uL (ref 150.0–400.0)
RBC: 4.84 Mil/uL (ref 3.87–5.11)
RDW: 14.4 % (ref 11.5–15.5)
WBC: 9.6 10*3/uL (ref 4.0–10.5)

## 2021-05-30 MED ORDER — SENNA-DOCUSATE SODIUM 8.6-50 MG PO TABS: 1.0000 | ORAL_TABLET | Freq: Every day | ORAL | 3 refills | Status: DC | PRN

## 2021-05-30 MED ORDER — GABAPENTIN 100 MG PO CAPS: 100.0000 mg | ORAL_CAPSULE | Freq: Two times a day (BID) | ORAL | 2 refills | Status: DC | PRN

## 2021-05-30 NOTE — Assessment & Plan Note (Addendum)
Blood pressure quite elevated today in office.  She was told to make sure she has an automated blood pressure cuff that is less than 86 years old, and to check blood pressure every day between now and next appointment.  She was told to write these down and bring with her to her next office visit.  She is agreeable to this.  Consider referral to cardiology for ambulatory blood pressure readings if in office readings continue to be elevated but at home readings are normal.  Follow-up in 2 weeks for close monitoring. ?

## 2021-05-30 NOTE — Assessment & Plan Note (Signed)
Acute, most likely related to her current herpes zoster infection.  We will check additional blood work for further evaluation.  We discussed risk versus benefit of doing CT scan of abdomen, per shared decision making patient will hold off on CT scan.  May consider ultrasound or CT scan if symptoms persist or worsen between now and next appointment.  She was told if symptoms worsen she should call 911 for emergent evaluation.  She reports her understanding. ?

## 2021-05-30 NOTE — Assessment & Plan Note (Signed)
Recommend over-the-counter senna S, 1 tablet by mouth daily as needed.  If symptoms persist consider further evaluation. ?

## 2021-05-30 NOTE — Progress Notes (Signed)
? ? ? ?Subjective:  ?Patient ID: Amy Atkins, female    DOB: [birth year]  Age: 88 y.o. MRN: [id number] ? ?CC:  ?Chief Complaint  ?Patient presents with  ? Follow-up  ?  Shingles since 4/17, still in pain and having discomfort. Loss of appetite due to nausea.   ?  ? ? ?HPI  ?This patient arrives today for the above. ? ?Approximately 2 weeks ago she started feeling generalized malaise and then a rash erupted on her right abdomen that wrapped around to her back.  She was evaluated in the emergency department and diagnosed with shingles.  She was treated with valacyclovir as well as hydrocodone.  She is accompanied today by her daughter who tells me that when the patient took hydrocodone she had a syncopal episode.  She has not taken any more hydrocodone and has not had any other syncopal episodes.  Patient continues to feel malaise, fatigue, nausea, constipation, and intermittent pain.  She also reports fevers but tells me that her baseline temperature is usually around 97 ?F she has seen her temperature as high as 98.6.  She tells me the pain to her right abdomen where the rash is located will range from a 4/10-10/10 in intensity.  She reports it as a jabbing pain.  The pain usually last for 1 minute, but frequently returns quickly.  She has completed her valacyclovir.  Per chart review I do see that her urine was checked and she was in the emergency department it did show some signs of possible UTI but culture was not sent as patient symptoms did not seem typical of UTI at that time. ? ?Patient has documented evidence of elevated blood pressure whenever she is in a healthcare setting.  She tells me that she has whitecoat syndrome, and blood pressure is normalized at home.  When asked specifically what her blood pressure numbers are she is able to recall the diastolic blood pressure normally in the 70s, but is unable to recall systolic blood pressure.  She does have an automatic blood pressure cuff available to her  and is willing to check her blood pressure at home. ? ?Past Medical History:  ?Diagnosis Date  ? Anxiety   ? Arthritis   ? GERD (gastroesophageal reflux disease)   ? Vitamin D deficiency   ? White coat hypertension   ? ? ? ? ?Family History  ?Problem Relation Age of Onset  ? Heart disease Mother   ? Hypertension Mother   ? Dementia Mother   ? Heart disease Father   ? Hypertension Father   ? Multiple sclerosis Father   ? Stroke Sister   ? Heart disease Brother   ? Seizures Brother   ? ? ?Social History  ? ?Social History Narrative  ? Not on file  ? ?Social History  ? ?Tobacco Use  ? Smoking status: Never  ? Smokeless tobacco: Never  ?Substance Use Topics  ? Alcohol use: Not on file  ? ? ? ?Current Meds  ?Medication Sig  ? gabapentin (NEURONTIN) 100 MG capsule Take 1 capsule (100 mg total) by mouth 2 (two) times daily as needed.  ? sennosides-docusate sodium (SENOKOT-S) 8.6-50 MG tablet Take 1 tablet by mouth daily as needed for constipation.  ? ? ?ROS:  ?Review of Systems  ?Constitutional:  Positive for fever (baseline 97.3, temp up to 98.6).  ?Cardiovascular:  Negative for chest pain.  ?Gastrointestinal:  Positive for abdominal pain, constipation and nausea. Negative for blood in stool and melena.  ?  Neurological:  Positive for headaches.  ? ? ?Objective:  ? ?Today's Vitals: BP (!) 198/92 (BP Location: Right Arm, Patient Position: Sitting, Cuff Size: Large)   Pulse 95   Temp 98 ?F (36.7 ?C)   Ht 5' 2.21" (1.58 m)   Wt 188 lb (85.3 kg)   SpO2 92%   BMI 34.16 kg/m?  ? ?  05/30/2021  ?  2:49 PM 05/21/2021  ? 12:00 AM 05/20/2021  ? 11:45 PM  ?Vitals with BMI  ?Height 5' 2.205"    ?Weight 188 lbs    ?BMI 34.16    ?Systolic 734 193 790  ?Diastolic 92 99 84  ?Pulse 95 84 79  ?  ? ?Physical Exam ?Vitals reviewed.  ?Constitutional:   ?   General: She is not in acute distress. ?   Appearance: Normal appearance.  ?HENT:  ?   Head: Normocephalic and atraumatic.  ?Neck:  ?   Vascular: No carotid bruit.  ?Cardiovascular:  ?    Rate and Rhythm: Normal rate and regular rhythm.  ?   Pulses: Normal pulses.  ?   Heart sounds: Normal heart sounds.  ?Pulmonary:  ?   Effort: Pulmonary effort is normal.  ?   Breath sounds: Normal breath sounds.  ?Skin: ?   General: Skin is warm and dry.  ? ?    ?   Comments: Vesicular rash, most lesions are closed, she does have some open lesions  ?Neurological:  ?   General: No focal deficit present.  ?   Mental Status: She is alert and oriented to person, place, and time.  ?Psychiatric:     ?   Mood and Affect: Mood normal.     ?   Behavior: Behavior normal.     ?   Judgment: Judgment normal.  ? ? ? ? ? ? ? ?Assessment and Plan  ? ?1. Herpes zoster without complication   ?2. Generalized abdominal pain   ?3. Constipation, unspecified constipation type   ?4. White coat syndrome without diagnosis of hypertension   ? ? ? ?Plan: ?See plan via problem list below. ? ? ?Tests ordered ?Orders Placed This Encounter  ?Procedures  ? Comp Met (CMET)  ? Lipase  ? CBC with Differential/Platelet  ? Urinalysis with Culture, if indicated  ? ? ? ? ?Meds ordered this encounter  ?Medications  ? gabapentin (NEURONTIN) 100 MG capsule  ?  Sig: Take 1 capsule (100 mg total) by mouth 2 (two) times daily as needed.  ?  Dispense:  60 capsule  ?  Refill:  2  ?  Order Specific Question:   Supervising Provider  ?  Answer:   Binnie Rail [[id number]]  ? sennosides-docusate sodium (SENOKOT-S) 8.6-50 MG tablet  ?  Sig: Take 1 tablet by mouth daily as needed for constipation.  ?  Dispense:  30 tablet  ?  Refill:  3  ?  Order Specific Question:   Supervising Provider  ?  Answer:   Binnie Rail [[id number]]  ? ? ?Patient to follow-up in 2 weeks or sooner as needed. ? ?Ailene Ards, NP ?He ?

## 2021-05-30 NOTE — Patient Instructions (Addendum)
Check your blood pressure daily and bring this log to your next appointment.  ? ? ? ?Shingles ? ?Shingles is an infection. It gives you a painful skin rash and blisters that have fluid in them. Shingles is caused by the same germ (virus) that causes chickenpox. ?Shingles only happens in people who: ?Have had chickenpox. ?Have been given a shot (vaccine) to protect against chickenpox. Shingles is rare in this group. ?What are the causes? ?This condition is caused by varicella-zoster virus. This is the same germ that causes chickenpox. After a person is exposed to the germ, the germ stays in the body but is not active (dormant). ?Shingles develops if the germ becomes active again (is reactivated). This can happen many years after the first exposure to the germ. It is not known what causes this germ to become active again. ?What increases the risk? ?People who have had chickenpox or received the chickenpox shot are at risk for shingles. This infection is more common in people who: ?Are older than 86 years of age. ?Have a weakened disease-fighting system (immune system), such as people with: ?HIV (human immunodeficiency virus). ?AIDS (acquired immunodeficiency syndrome). ?Cancer. ?Are taking medicines that weaken the immune system, such as organ transplant medicines. ?Have a lot of stress. ?What are the signs or symptoms? ?The first symptoms of shingles may be itching, tingling, or pain in an area on your skin. ?A rash will show on your skin a few days or weeks later. This is what usually happens: ?The rash is likely to be on one side of your body. ?The rash usually has a shape like a belt or a band. Over time, the rash turns into fluid-filled blisters. ?The blisters will break open and change into scabs. ?The scabs usually dry up in about 2-3 weeks. ?You may also have: ?A fever. ?Chills. ?A headache. ?A feeling like you may vomit (nausea). ?How is this treated? ?The rash may last for several weeks. There is not a  specific cure for this condition. ?Your doctor may prescribe medicines. Medicines may: ?Help with pain. ?Help you get better sooner. ?Help to prevent long-term problems. ?Help with itching (antihistamines). ?If the area involved is on your face, you may need to see a specialist. This may be an eye doctor or an ear, nose, and throat (ENT) doctor. ?Follow these instructions at home: ?Medicines ?Take over-the-counter and prescription medicines only as told by your doctor. ?Put on an anti-itch cream or numbing cream where you have a rash, blisters, or scabs. Do this as told by your doctor. ?Helping with itching and discomfort ? ?Put cold, wet cloths (cold compresses) on the area of the rash or blisters as told by your doctor. ?Cool baths can help you feel better. Try adding baking soda or dry oatmeal to the water to lessen itching. Do not bathe in hot water. ?Use calamine lotion as told by your doctor. ?Blister and rash care ?Keep your rash covered with a loose bandage (dressing). ?Wear loose clothing that does not rub on your rash. ?Wash your hands with soap and water for at least 20 seconds before and after you change your bandage. If you cannot use soap and water, use hand sanitizer. ?Change your bandage as told by your doctor. ?Keep your rash and blisters clean. To do this, wash the area with mild soap and cool water as told by your doctor. ?Check your rash every day for signs of infection. Check for: ?More redness, swelling, or pain. ?Fluid or blood. ?Warmth. ?  Pus or a bad smell. ?Do not scratch your rash. Do not pick at your blisters. To help you to not scratch: ?Keep your fingernails clean and cut short. ?Wear gloves or mittens when you sleep, if scratching is a problem. ?General instructions ?Rest as told by your doctor. ?Wash your hands often with soap and water for at least 20 seconds. If you cannot use soap and water, use hand sanitizer. Doing this lowers your chance of getting a skin infection. ?Your  infection can cause chickenpox in people who have never had chickenpox or never got a chickenpox vaccine shot. If you have blisters that did not change into scabs yet, try not to touch other people or be around other people, especially: ?Babies. ?Pregnant women. ?Children who have areas of red, itchy, or rough skin (eczema). ?Older people who have organ transplants. ?People who have a long-term (chronic) illness, like cancer or AIDS. ?Keep all follow-up visits. ?How is this prevented? ?A vaccine shot is the best way to prevent shingles and protect against shingles problems. ?If you have not had a vaccine shot, talk with your doctor about getting it. ?Where to find more information ?Centers for Disease Control and Prevention: FootballExhibition.com.br ?Contact a doctor if: ?Your pain does not get better with medicine. ?Your pain does not get better after the rash heals. ?You have any of these signs of infection around the rash: ?More redness, swelling, or pain. ?Fluid or blood. ?Warmth. ?Pus or a bad smell. ?You have a fever. ?Get help right away if: ?The rash is on your face or nose. ?You have pain in your face or pain by your eye. ?You lose feeling on one side of your face. ?You have trouble seeing. ?You have ear pain, or you have ringing in your ear. ?You have a loss of taste. ?Your condition gets worse. ?Summary ?Shingles gives you a painful skin rash and blisters that have fluid in them. ?Shingles is caused by the same germ (virus) that causes chickenpox. ?Keep your rash covered with a loose bandage. Wear loose clothing that does not rub on your rash. ?If you have blisters that did not change into scabs yet, try not to touch other people or be around people. ?This information is not intended to replace advice given to you by your health care provider. Make sure you discuss any questions you have with your health care provider. ?Document Revised: 01/16/2020 Document Reviewed: 01/16/2020 ?Elsevier Patient Education ? 2023  Elsevier Inc. ? ?

## 2021-05-30 NOTE — Assessment & Plan Note (Addendum)
Acute, some lesions are still open however most seem to be closed.  Will focus on symptom management.  Prescription for gabapentin 100 mg by mouth twice a day as needed for pain ordered today, she will follow-up in about 2 weeks for close monitoring. ?

## 2021-05-31 ENCOUNTER — Telehealth: Payer: Self-pay | Admitting: Nurse Practitioner

## 2021-05-31 LAB — COMPREHENSIVE METABOLIC PANEL
ALT: 18 U/L (ref 0–35)
AST: 16 U/L (ref 0–37)
Albumin: 4.6 g/dL (ref 3.5–5.2)
Alkaline Phosphatase: 64 U/L (ref 39–117)
BUN: 12 mg/dL (ref 6–23)
CO2: 26 mEq/L (ref 19–32)
Calcium: 10.2 mg/dL (ref 8.4–10.5)
Chloride: 101 mEq/L (ref 96–112)
Creatinine, Ser: 0.81 mg/dL (ref 0.40–1.20)
GFR: 64.91 mL/min (ref >60.00)
Glucose, Bld: 109 mg/dL — ABNORMAL HIGH (ref 70–99)
Potassium: 4.4 mEq/L (ref 3.5–5.1)
Sodium: 136 mEq/L (ref 135–145)
Total Bilirubin: 0.4 mg/dL (ref 0.2–1.2)
Total Protein: 7.7 g/dL (ref 6.0–8.3)

## 2021-05-31 LAB — URINE CULTURE
MICRO NUMBER:: 13320982
Result:: NO GROWTH
SPECIMEN QUALITY:: ADEQUATE

## 2021-05-31 LAB — LIPASE: Lipase: 15 U/L (ref 11.0–59.0)

## 2021-05-31 NOTE — Telephone Encounter (Signed)
Pt called in and states she was rx Gabapentin and when she took it yesterday, she was unable to stay awake, "wiped her out" and caused her to be unsteady on her feet.  ? ?Wondering if she should continue taking the medication with these side effects.  ? ?Please contact pt to discuss alternative.  ?

## 2021-05-31 NOTE — Telephone Encounter (Signed)
I called the patient and let her know you recommend her to stop using the gabapentin and use the lidocaine cream instead and Tylenol as needed. She expresses that she has not been sleeping well in the last two weeks and asks if she could take one of Gabapentin instead. I let her know that I would call her back with an answer. ?

## 2021-06-13 ENCOUNTER — Ambulatory Visit (INDEPENDENT_AMBULATORY_CARE_PROVIDER_SITE_OTHER): Payer: Medicare Other | Admitting: Nurse Practitioner

## 2021-06-13 VITALS — BP 130/82 | HR 106 | Temp 98.5°F | Ht 62.0 in | Wt 183.0 lb

## 2021-06-13 DIAGNOSIS — G8929 Other chronic pain: Secondary | ICD-10-CM | POA: Diagnosis not present

## 2021-06-13 DIAGNOSIS — R030 Elevated blood-pressure reading, without diagnosis of hypertension: Secondary | ICD-10-CM | POA: Diagnosis not present

## 2021-06-13 DIAGNOSIS — B0229 Other postherpetic nervous system involvement: Secondary | ICD-10-CM

## 2021-06-13 DIAGNOSIS — R5383 Other fatigue: Secondary | ICD-10-CM | POA: Diagnosis not present

## 2021-06-13 DIAGNOSIS — R300 Dysuria: Secondary | ICD-10-CM

## 2021-06-13 DIAGNOSIS — M545 Low back pain, unspecified: Secondary | ICD-10-CM

## 2021-06-13 DIAGNOSIS — N950 Postmenopausal bleeding: Secondary | ICD-10-CM | POA: Insufficient documentation

## 2021-06-13 LAB — POCT URINALYSIS DIPSTICK
Bilirubin, UA: NEGATIVE
Glucose, UA: NEGATIVE
Ketones, UA: 2
Nitrite, UA: NEGATIVE
Protein, UA: NEGATIVE
Spec Grav, UA: 1.015 (ref 1.010–1.025)
Urobilinogen, UA: NEGATIVE E.U./dL — AB
pH, UA: 5.5 (ref 5.0–8.0)

## 2021-06-13 MED ORDER — SULFAMETHOXAZOLE-TRIMETHOPRIM 800-160 MG PO TABS: 1.0000 | ORAL_TABLET | Freq: Two times a day (BID) | ORAL | 0 refills | Status: DC

## 2021-06-13 NOTE — Patient Instructions (Addendum)
Taper off of the gabapentin, take 1 capsule by mouth daily for 1 week. Then take 1 capsule by mouth every other day for 1 week. Then stop the gabapentin.  ? ?[facility]: [phone] or [phone].  ? ?[facility]:  [address], [place], [place] [address] ?

## 2021-06-13 NOTE — Assessment & Plan Note (Signed)
This is still present, but seems to be improving.  We will taper off of gabapentin to see if this also resulted in improvement in fatigue.  Taper instructions provided to patient.  May consider restarting if pain worsens with cessation of gabapentin. ?

## 2021-06-13 NOTE — Assessment & Plan Note (Signed)
On physical exam, it appears she has a vaginal source of bleeding.  I have not performed internal vaginal exam or Pap smear since school due to previous supervising physician also not performing these exams.  For this reason, internal vaginal exam not completed.  Her external exam did show blood around the opening of the vagina, referral to OB/GYN made today for further evaluation of probable postmenopausal vaginal bleeding. ?

## 2021-06-13 NOTE — Assessment & Plan Note (Signed)
Patient will proceed to lab next (lab closed prior to the end of this visit, and patient is unable to come tomorrow) week for blood work to be collected for further evaluation.  Hopefully this is a side effect of her gabapentin and this will improve with cessation of gabapentin.  Also concern for possible malignancy as she is having postmenopausal vaginal bleeding.  She has been referred to OB/GYN for further evaluation of this.  Further recommendations may also be made based upon blood work results. ?

## 2021-06-13 NOTE — Assessment & Plan Note (Signed)
Chronic, stable.  Referral to physical therapy ordered today for assistance with evaluation and management.  May consider x-ray if symptoms persist or worsen.  Patient warned of cauda equina syndrome and told about signs and symptoms of this and to proceed to emergency department if these occur. ?

## 2021-06-13 NOTE — Assessment & Plan Note (Signed)
Unfortunately, lab closed before end of visit today.  However point-of-care urinalysis does show leukocytes, will treat empirically for UTI with Bactrim.  Patient counseled on possible side effects including Stevens-Johnson's rash and if this were to occur to stop the medication. ?

## 2021-06-13 NOTE — Assessment & Plan Note (Signed)
Blood pressure much improved today.  May be related to pain improvement with shingles rash improving.  For now we will keep her off of antihypertensive. ?

## 2021-06-13 NOTE — Progress Notes (Addendum)
? ? ? ?Subjective:  ?Patient ID: Amy Atkins, female    DOB: 01/14/1932  Age: 88 y.o. MRN: [id number] ? ?CC:  ?Chief Complaint  ?Patient presents with  ? Follow-up  ? Dysuria  ?  ? ? ?HPI  ?This patient arrives today for the above. ? ?Dysuria/hematuria: Reports burning with urination.  She is also noted blood in her urine as well.  Per chart review I do see that last 2 urinalysis first on 4/17 and second on 4/27 were positive for hemoglobin.  Patient is not sure if this could be vaginal source as opposed to urinary tract source.  Urine culture ordered on 4/27 was negative for bacterial growth.  Creatinine clearance is approximately 48. ? ?Shingles: She has recent history of shingles infection on her right abdomen.  She was treated with acyclovir as well as gabapentin (148m BID) for postherpetic neuralgia.  She reports today that rash is improving, she is been feeling significantly fatigued which she is not sure if this started when she started the gabapentin or if it started before that.  Last office visit on 4/27 was when she was started on gabapentin, at that time blood work showed stable kidney function with GFR of 64, normal electrolytes, normal liver enzymes, and was negative for anemia. ? ?W[place]hypertension: Patient had elevated blood pressure at last office visit.  Per shared decision making she decided to check blood pressure at home and keep a log of this as opposed to starting antihypertensive.  She brings her log in today.  Over the last 2 weeks blood pressure has generally been within goal, she has had 4 readings outside of goal however these all occurred earlier on in the 2 weeks when her pain was more severe related to her shingles.  Blood pressure readings are as follows: 153/75, 130/69, 174/95, 144/82, 145/83, 125/74, 123/74, 127/64, 136/76, 128/75. ? ?Low back pain: She reports chronic history of low back pain.  Per chart review I do see that she had imaging done in 2018.  Imaging showed  degenerative disc disease at T12-L1 and L1-L2.  Facet hypertrophy at L5-S1 with associated mild anterolisthesis of L5 on S1.  She denies any new or worsening urinary or bowel incontinence.  She denies changes to sensation in her lower extremities or worsening weakness. ? ? ?Past Medical History:  ?Diagnosis Date  ? Anxiety   ? Arthritis   ? GERD (gastroesophageal reflux disease)   ? Vitamin D deficiency   ? White coat hypertension   ? ? ? ? ?Family History  ?Problem Relation Age of Onset  ? Heart disease Mother   ? Hypertension Mother   ? Dementia Mother   ? Heart disease Father   ? Hypertension Father   ? Multiple sclerosis Father   ? Stroke Sister   ? Heart disease Brother   ? Seizures Brother   ? ? ?Social History  ? ?Social History Narrative  ? Not on file  ? ?Social History  ? ?Tobacco Use  ? Smoking status: Never  ? Smokeless tobacco: Never  ?Substance Use Topics  ? Alcohol use: Not on file  ? ? ? ?Current Meds  ?Medication Sig  ? sennosides-docusate sodium (SENOKOT-S) 8.6-50 MG tablet Take 1 tablet by mouth daily as needed for constipation.  ? [DISCONTINUED] gabapentin (NEURONTIN) 100 MG capsule Take 1 capsule (100 mg total) by mouth 2 (two) times daily as needed.  ? [DISCONTINUED] sulfamethoxazole-trimethoprim (BACTRIM DS) 800-160 MG tablet Take 1 tablet by mouth 2 (  two) times daily.  ? ? ?ROS:  ?See HPI ? ? ?Objective:  ? ?Today's Vitals: BP 130/82 (BP Location: Right Arm, Patient Position: Sitting, Cuff Size: Large)   Pulse (!) 106   Temp 98.5 ?F (36.9 ?C) (Oral)   Ht 5' 2" (1.575 m)   Wt 183 lb (83 kg)   SpO2 97%   BMI 33.47 kg/m?  ? ?  06/13/2021  ?  4:05 PM 05/30/2021  ?  2:49 PM 05/21/2021  ? 12:00 AM  ?Vitals with BMI  ?Height 5' 2" 5' 2.205"   ?Weight 183 lbs 188 lbs   ?BMI 33.46 34.16   ?Systolic 517 001 749  ?Diastolic 82 92 99  ?Pulse 106 95 84  ?  ? ?Physical Exam ?Vitals reviewed. Exam conducted with a chaperone present.  ?Constitutional:   ?   Appearance: Normal appearance.  ?HENT:  ?    Head: Normocephalic and atraumatic.  ?   Right Ear: Tympanic membrane, ear canal and external ear normal.  ?   Left Ear: Tympanic membrane, ear canal and external ear normal.  ?Eyes:  ?   General:     ?   Right eye: No discharge.     ?   Left eye: No discharge.  ?   Extraocular Movements: Extraocular movements intact.  ?   Conjunctiva/sclera: Conjunctivae normal.  ?   Pupils: Pupils are equal, round, and reactive to light.  ?Neck:  ?   Vascular: No carotid bruit.  ?Cardiovascular:  ?   Rate and Rhythm: Normal rate and regular rhythm.  ?   Pulses: Normal pulses.  ?   Heart sounds: Normal heart sounds. No murmur heard. ?Pulmonary:  ?   Effort: Pulmonary effort is normal.  ?   Breath sounds: Normal breath sounds.  ?Chest:  ?Breasts: ?   Breasts are symmetrical.  ?   Right: Normal.  ?   Left: Normal.  ?Abdominal:  ?   General: Abdomen is flat. Bowel sounds are normal. There is no distension.  ?   Palpations: Abdomen is soft. There is no mass.  ?   Tenderness: There is no abdominal tenderness.  ?Genitourinary: ?   General: Normal vulva.  ?   Comments: Visible blood seen at vaginal opening; no pap smear performed due to lack of experience performing pap smears ? ?[facility] Nurse as chaperone ?Musculoskeletal:     ?   General: No tenderness.  ?   Cervical back: Neck supple. No muscular tenderness.  ?   Right lower leg: No edema.  ?   Left lower leg: No edema.  ?Lymphadenopathy:  ?   Cervical: No cervical adenopathy.  ?   Upper Body:  ?   Right upper body: No supraclavicular adenopathy.  ?   Left upper body: No supraclavicular adenopathy.  ?Skin: ?   General: Skin is warm and dry.  ? ?    ?   Comments: Redline indicates location of shingles rash, all lesions are now closed.  No signs of secondary bacterial infection noted.  ?Neurological:  ?   General: No focal deficit present.  ?   Mental Status: She is alert and oriented to person, place, and time.  ?   Sensory: Sensation is intact.  ?   Motor: Motor function is intact. No  weakness.  ?   Gait: Gait is intact.  ?Psychiatric:     ?   Mood and Affect: Mood normal.     ?   Behavior: Behavior normal.     ?  Judgment: Judgment normal.  ? ? ? ? ? ? ? ?Assessment and Plan  ? ?1. Postmenopausal vaginal bleeding   ?2. Dysuria   ?3. Fatigue, unspecified type   ?4. Postherpetic neuralgia   ?5. Chronic bilateral low back pain without sciatica   ?6. White coat syndrome without diagnosis of hypertension   ? ? ? ?Plan: ?See plan via problem list below. ? ? ?Tests ordered ?Orders Placed This Encounter  ?Procedures  ? Urine Culture  ? CBC  ? Comp Met (CMET)  ? TSH  ? T3, free  ? T4, free  ? Ambulatory referral to Obstetrics / Gynecology  ? Ambulatory referral to Physical Therapy  ? POCT Urinalysis Dipstick  ? ? ? ? ?Meds ordered this encounter  ?Medications  ? DISCONTD: sulfamethoxazole-trimethoprim (BACTRIM DS) 800-160 MG tablet  ?  Sig: Take 1 tablet by mouth 2 (two) times daily.  ?  Dispense:  6 tablet  ?  Refill:  0  ?  Order Specific Question:   Supervising Provider  ?  Answer:   BURNS, STACY J [[id number]]  ? sulfamethoxazole-trimethoprim (BACTRIM DS) 800-160 MG tablet  ?  Sig: Take 1 tablet by mouth 2 (two) times daily.  ?  Dispense:  6 tablet  ?  Refill:  0  ?  Order Specific Question:   Supervising Provider  ?  Answer:   BURNS, STACY J [[id number]]  ? ? ?Patient to follow-up in 6 to 8 weeks, or sooner as needed. ? ?SARAH E GRAY, NP ? ?

## 2021-07-09 ENCOUNTER — Encounter: Payer: Medicare Other | Admitting: Obstetrics & Gynecology

## 2021-08-08 ENCOUNTER — Ambulatory Visit: Payer: Medicare Other | Admitting: Nurse Practitioner

## 2021-08-09 ENCOUNTER — Ambulatory Visit: Payer: Medicare Other | Admitting: Nurse Practitioner

## 2021-08-16 ENCOUNTER — Ambulatory Visit (INDEPENDENT_AMBULATORY_CARE_PROVIDER_SITE_OTHER): Payer: Medicare Other | Admitting: Nurse Practitioner

## 2021-08-16 VITALS — BP 130/72 | HR 94 | Temp 98.0°F | Ht 62.0 in | Wt 183.0 lb

## 2021-08-16 DIAGNOSIS — B0229 Other postherpetic nervous system involvement: Secondary | ICD-10-CM | POA: Diagnosis not present

## 2021-08-16 DIAGNOSIS — R5383 Other fatigue: Secondary | ICD-10-CM

## 2021-08-16 DIAGNOSIS — N950 Postmenopausal bleeding: Secondary | ICD-10-CM | POA: Diagnosis not present

## 2021-08-16 DIAGNOSIS — G8929 Other chronic pain: Secondary | ICD-10-CM | POA: Diagnosis not present

## 2021-08-16 DIAGNOSIS — R030 Elevated blood-pressure reading, without diagnosis of hypertension: Secondary | ICD-10-CM | POA: Diagnosis not present

## 2021-08-16 DIAGNOSIS — M545 Low back pain, unspecified: Secondary | ICD-10-CM | POA: Diagnosis not present

## 2021-08-16 NOTE — Assessment & Plan Note (Signed)
Chronic, encouraged her to follow-up with physical therapy as scheduled for evaluation and assistance with management.  She reports that she will plan on doing so.

## 2021-08-16 NOTE — Assessment & Plan Note (Signed)
Seems to have ceased at this time, however had discussion regarding my recommendation that she still undergo evaluation (discussed this can be caused by things such as cervical cancer or uterine cancer and that without undergoing evaluation this could go undiagnosed which could result in poor outcomes including death).  She is provided phone number for [facility] which is the gynecologist office that she was referred to earlier.  She reports her understanding and plans on giving them a call to schedule an appointment.

## 2021-08-16 NOTE — Patient Instructions (Signed)
[facility]: [phone]

## 2021-08-16 NOTE — Progress Notes (Signed)
Established Patient Office Visit  Subjective   Patient ID: Amy Atkins, female    DOB: [birth year]  Age: 88 y.o. MRN: [id number]  Chief Complaint  Patient presents with   Follow up    Fatigue and back pain. Still having shingles pain    Shingles pain/back pain: Continues to have postherpetic neuralgia, however reports pain is improving day-to-day.  She also continues to experience some low back pain which she reports has been chronic for "years".  She is scheduled for eval physical therapy coming up in the near future.  Denies sensory changes to her lower extremities as well as worsening weakness. Fatigue/vaginal bleeding: She did not follow-up with OB/GYN for evaluation of vaginal bleeding.  She reports she has not had any recurrence of this and seems to have stopped since last time I saw her a couple of months ago.  She does continue to have fatigue. Whitecoat syndrome: She brings in at home blood pressure readings which show 120/72, 134/74, 144/79, 120/71, 128/73    Review of Systems  Respiratory:  Positive for shortness of breath and wheezing. Negative for cough.       Objective:     BP 130/72 (BP Location: Right Arm, Patient Position: Sitting, Cuff Size: Large)   Pulse 94   Temp 98 F (36.7 C) (Oral)   Ht 5\' 2"  (1.575 m)   Wt 183 lb (83 kg)   SpO2 96%   BMI 33.47 kg/m    Physical Exam Vitals reviewed.  Constitutional:      General: She is not in acute distress.    Appearance: Normal appearance.  HENT:     Head: Normocephalic and atraumatic.  Neck:     Vascular: No carotid bruit.  Cardiovascular:     Rate and Rhythm: Normal rate and regular rhythm.     Pulses: Normal pulses.     Heart sounds: Normal heart sounds.  Pulmonary:     Effort: Pulmonary effort is normal.     Breath sounds: Normal breath sounds.  Skin:    General: Skin is warm and dry.  Neurological:     General: No focal deficit present.     Mental Status: She is alert and oriented to person,  place, and time.  Psychiatric:        Mood and Affect: Mood normal.        Behavior: Behavior normal.        Judgment: Judgment normal.      No results found for any visits on 08/16/21.    The ASCVD Risk score (Arnett DK, et al., 2019) failed to calculate for the following reasons:   The 2019 ASCVD risk score is only valid for ages 32 to 75    Assessment & Plan:   Problem List Items Addressed This Visit       Cardiovascular and Mediastinum   White coat syndrome without diagnosis of hypertension    Stable, at home blood pressure readings reasonably controlled.  We will continue off of antihypertensives at this time.        Nervous and Auditory   Postherpetic neuralgia - Primary    Gradually improving, continue off of gabapentin for now.        Other   Postmenopausal vaginal bleeding    Seems to have ceased at this time, however had discussion regarding my recommendation that she still undergo evaluation (discussed this can be caused by things such as cervical cancer or uterine cancer and that without undergoing evaluation  this could go undiagnosed which could result in poor outcomes including death).  She is provided phone number for [facility] which is the gynecologist office that she was referred to earlier.  She reports her understanding and plans on giving them a call to schedule an appointment.      Fatigue    Chronic, patient prefers not to have additional evaluation at this time.  Patient encouraged to let me know if fatigue worsens or if she experiences worsening shortness of breath or chest pain.  She reports her understanding.      Chronic bilateral low back pain without sciatica    Chronic, encouraged her to follow-up with physical therapy as scheduled for evaluation and assistance with management.  She reports that she will plan on doing so.       Return in about 6 months (around 02/16/2022).    Elenore Paddy, NP

## 2021-08-16 NOTE — Assessment & Plan Note (Signed)
Chronic, patient prefers not to have additional evaluation at this time.  Patient encouraged to let me know if fatigue worsens or if she experiences worsening shortness of breath or chest pain.  She reports her understanding.

## 2021-08-16 NOTE — Assessment & Plan Note (Signed)
Gradually improving, continue off of gabapentin for now.

## 2021-08-16 NOTE — Assessment & Plan Note (Signed)
Stable, at home blood pressure readings reasonably controlled.  We will continue off of antihypertensives at this time.

## 2021-09-04 ENCOUNTER — Ambulatory Visit (HOSPITAL_COMMUNITY): Payer: Medicare Other | Admitting: Physical Therapy

## 2021-09-13 ENCOUNTER — Encounter (HOSPITAL_COMMUNITY): Payer: Self-pay | Admitting: Physical Therapy

## 2021-09-13 ENCOUNTER — Ambulatory Visit (HOSPITAL_COMMUNITY): Payer: Medicare Other | Attending: Nurse Practitioner | Admitting: Physical Therapy

## 2021-09-13 DIAGNOSIS — M6281 Muscle weakness (generalized): Secondary | ICD-10-CM | POA: Insufficient documentation

## 2021-09-13 DIAGNOSIS — M5416 Radiculopathy, lumbar region: Secondary | ICD-10-CM | POA: Insufficient documentation

## 2021-09-13 DIAGNOSIS — R262 Difficulty in walking, not elsewhere classified: Secondary | ICD-10-CM | POA: Diagnosis not present

## 2021-09-13 DIAGNOSIS — G8929 Other chronic pain: Secondary | ICD-10-CM | POA: Diagnosis not present

## 2021-09-13 DIAGNOSIS — M545 Low back pain, unspecified: Secondary | ICD-10-CM | POA: Diagnosis not present
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
## 2021-09-17 ENCOUNTER — Ambulatory Visit (HOSPITAL_COMMUNITY): Payer: Medicare Other

## 2021-09-17 ENCOUNTER — Encounter (HOSPITAL_COMMUNITY): Payer: Self-pay

## 2021-09-17 DIAGNOSIS — M5416 Radiculopathy, lumbar region: Secondary | ICD-10-CM

## 2021-09-17 DIAGNOSIS — G8929 Other chronic pain: Secondary | ICD-10-CM | POA: Diagnosis not present

## 2021-09-17 DIAGNOSIS — M545 Low back pain, unspecified: Secondary | ICD-10-CM | POA: Diagnosis not present

## 2021-09-17 DIAGNOSIS — R262 Difficulty in walking, not elsewhere classified: Secondary | ICD-10-CM

## 2021-09-17 DIAGNOSIS — M6281 Muscle weakness (generalized): Secondary | ICD-10-CM | POA: Diagnosis not present
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
## 2021-09-19 ENCOUNTER — Ambulatory Visit (HOSPITAL_COMMUNITY): Payer: Medicare Other

## 2021-09-19 ENCOUNTER — Encounter (HOSPITAL_COMMUNITY): Payer: Self-pay

## 2021-09-19 DIAGNOSIS — R262 Difficulty in walking, not elsewhere classified: Secondary | ICD-10-CM

## 2021-09-19 DIAGNOSIS — M5416 Radiculopathy, lumbar region: Secondary | ICD-10-CM | POA: Diagnosis not present

## 2021-09-19 DIAGNOSIS — G8929 Other chronic pain: Secondary | ICD-10-CM | POA: Diagnosis not present

## 2021-09-19 DIAGNOSIS — M6281 Muscle weakness (generalized): Secondary | ICD-10-CM | POA: Diagnosis not present

## 2021-09-19 DIAGNOSIS — M545 Low back pain, unspecified: Secondary | ICD-10-CM | POA: Diagnosis not present
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
## 2021-09-24 ENCOUNTER — Telehealth (HOSPITAL_COMMUNITY): Payer: Self-pay | Admitting: Physical Therapy

## 2021-09-24 ENCOUNTER — Encounter (HOSPITAL_COMMUNITY): Payer: Medicare Other | Admitting: Physical Therapy
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
## 2021-09-26 ENCOUNTER — Ambulatory Visit (HOSPITAL_COMMUNITY): Payer: Medicare Other

## 2021-09-26 ENCOUNTER — Encounter (HOSPITAL_COMMUNITY): Payer: Self-pay

## 2021-09-26 DIAGNOSIS — R262 Difficulty in walking, not elsewhere classified: Secondary | ICD-10-CM

## 2021-09-26 DIAGNOSIS — M5416 Radiculopathy, lumbar region: Secondary | ICD-10-CM | POA: Diagnosis not present

## 2021-09-26 DIAGNOSIS — G8929 Other chronic pain: Secondary | ICD-10-CM | POA: Diagnosis not present

## 2021-09-26 DIAGNOSIS — M6281 Muscle weakness (generalized): Secondary | ICD-10-CM

## 2021-09-26 DIAGNOSIS — M545 Low back pain, unspecified: Secondary | ICD-10-CM | POA: Diagnosis not present
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
## 2021-09-26 NOTE — Patient Instructions (Signed)
On Elbows (Prone)    Rise up on elbows as high as possible, keeping hips on floor. Hold 2 minutes  Repeat 1 times per set. Do 2 sets per session.   [email or web address]   Copyright  VHI. All rights reserved.

## 2021-10-02 ENCOUNTER — Ambulatory Visit (HOSPITAL_COMMUNITY): Payer: Medicare Other

## 2021-10-02 DIAGNOSIS — M6281 Muscle weakness (generalized): Secondary | ICD-10-CM | POA: Diagnosis not present

## 2021-10-02 DIAGNOSIS — M5416 Radiculopathy, lumbar region: Secondary | ICD-10-CM

## 2021-10-02 DIAGNOSIS — R262 Difficulty in walking, not elsewhere classified: Secondary | ICD-10-CM | POA: Diagnosis not present

## 2021-10-02 DIAGNOSIS — M545 Low back pain, unspecified: Secondary | ICD-10-CM | POA: Diagnosis not present

## 2021-10-02 DIAGNOSIS — G8929 Other chronic pain: Secondary | ICD-10-CM | POA: Diagnosis not present
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
## 2021-10-04 ENCOUNTER — Ambulatory Visit (HOSPITAL_COMMUNITY): Payer: Medicare Other

## 2021-10-09 ENCOUNTER — Ambulatory Visit (HOSPITAL_COMMUNITY): Payer: Medicare Other | Attending: Nurse Practitioner

## 2021-10-09 ENCOUNTER — Encounter (HOSPITAL_COMMUNITY): Payer: Self-pay

## 2021-10-09 DIAGNOSIS — R262 Difficulty in walking, not elsewhere classified: Secondary | ICD-10-CM | POA: Diagnosis not present

## 2021-10-09 DIAGNOSIS — M5416 Radiculopathy, lumbar region: Secondary | ICD-10-CM | POA: Insufficient documentation

## 2021-10-09 DIAGNOSIS — M6281 Muscle weakness (generalized): Secondary | ICD-10-CM | POA: Insufficient documentation
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
## 2021-10-15 ENCOUNTER — Ambulatory Visit (HOSPITAL_COMMUNITY): Payer: Medicare Other | Admitting: Physical Therapy

## 2021-10-15 DIAGNOSIS — R262 Difficulty in walking, not elsewhere classified: Secondary | ICD-10-CM | POA: Diagnosis not present

## 2021-10-15 DIAGNOSIS — M5416 Radiculopathy, lumbar region: Secondary | ICD-10-CM

## 2021-10-15 DIAGNOSIS — M6281 Muscle weakness (generalized): Secondary | ICD-10-CM
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
## 2021-10-18 ENCOUNTER — Ambulatory Visit (HOSPITAL_COMMUNITY): Payer: Medicare Other | Admitting: Physical Therapy

## 2021-10-18 DIAGNOSIS — M6281 Muscle weakness (generalized): Secondary | ICD-10-CM

## 2021-10-18 DIAGNOSIS — M5416 Radiculopathy, lumbar region: Secondary | ICD-10-CM | POA: Diagnosis not present

## 2021-10-18 DIAGNOSIS — R262 Difficulty in walking, not elsewhere classified: Secondary | ICD-10-CM
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
## 2021-10-21 ENCOUNTER — Ambulatory Visit (HOSPITAL_COMMUNITY): Payer: Medicare Other

## 2021-10-21 DIAGNOSIS — R262 Difficulty in walking, not elsewhere classified: Secondary | ICD-10-CM | POA: Diagnosis not present

## 2021-10-21 DIAGNOSIS — M6281 Muscle weakness (generalized): Secondary | ICD-10-CM

## 2021-10-21 DIAGNOSIS — M5416 Radiculopathy, lumbar region: Secondary | ICD-10-CM
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
## 2021-10-23 ENCOUNTER — Ambulatory Visit (HOSPITAL_COMMUNITY): Payer: Medicare Other

## 2021-10-23 DIAGNOSIS — R262 Difficulty in walking, not elsewhere classified: Secondary | ICD-10-CM

## 2021-10-23 DIAGNOSIS — M5416 Radiculopathy, lumbar region: Secondary | ICD-10-CM | POA: Diagnosis not present

## 2021-10-23 DIAGNOSIS — M6281 Muscle weakness (generalized): Secondary | ICD-10-CM | POA: Diagnosis not present
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
## 2021-10-29 ENCOUNTER — Ambulatory Visit (HOSPITAL_COMMUNITY): Payer: Medicare Other | Admitting: Physical Therapy

## 2021-10-29 ENCOUNTER — Encounter (HOSPITAL_COMMUNITY): Payer: Medicare Other

## 2021-10-29 DIAGNOSIS — M5416 Radiculopathy, lumbar region: Secondary | ICD-10-CM

## 2021-10-29 DIAGNOSIS — M6281 Muscle weakness (generalized): Secondary | ICD-10-CM

## 2021-10-29 DIAGNOSIS — R262 Difficulty in walking, not elsewhere classified: Secondary | ICD-10-CM
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
## 2021-10-31 ENCOUNTER — Ambulatory Visit (HOSPITAL_COMMUNITY): Payer: Medicare Other

## 2021-10-31 ENCOUNTER — Encounter (HOSPITAL_COMMUNITY): Payer: Medicare Other | Admitting: Physical Therapy

## 2021-11-11 ENCOUNTER — Ambulatory Visit (HOSPITAL_COMMUNITY): Payer: Medicare Other | Attending: Nurse Practitioner | Admitting: Physical Therapy

## 2021-11-11 DIAGNOSIS — M5416 Radiculopathy, lumbar region: Secondary | ICD-10-CM | POA: Diagnosis not present

## 2021-11-11 DIAGNOSIS — R262 Difficulty in walking, not elsewhere classified: Secondary | ICD-10-CM | POA: Diagnosis not present

## 2021-11-11 DIAGNOSIS — M6281 Muscle weakness (generalized): Secondary | ICD-10-CM | POA: Diagnosis not present
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
## 2021-11-13 ENCOUNTER — Ambulatory Visit (HOSPITAL_COMMUNITY): Payer: Medicare Other | Admitting: Physical Therapy

## 2021-11-13 DIAGNOSIS — M6281 Muscle weakness (generalized): Secondary | ICD-10-CM | POA: Diagnosis not present

## 2021-11-13 DIAGNOSIS — R262 Difficulty in walking, not elsewhere classified: Secondary | ICD-10-CM

## 2021-11-13 DIAGNOSIS — M5416 Radiculopathy, lumbar region: Secondary | ICD-10-CM | POA: Diagnosis not present
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
## 2021-11-21 ENCOUNTER — Telehealth: Payer: Self-pay | Admitting: Nurse Practitioner

## 2021-11-21 NOTE — Telephone Encounter (Signed)
LVM for pt to rtn my call to schedule AWV with [facility] call back # [phone] 

## 2021-12-03 ENCOUNTER — Telehealth: Payer: Self-pay | Admitting: Nurse Practitioner

## 2021-12-03 NOTE — Telephone Encounter (Signed)
Left message for patient to call back to schedule Medicare Annual Wellness Visit   No hx of AWV   Please schedule at anytime with [facility]-Nurse Health Advisor if patient calls the office back.     Any questions, please call me at [phone]  

## 2021-12-20 ENCOUNTER — Ambulatory Visit (INDEPENDENT_AMBULATORY_CARE_PROVIDER_SITE_OTHER): Payer: Medicare Other | Admitting: *Deleted

## 2021-12-20 DIAGNOSIS — Z0000 Encounter for general adult medical examination without abnormal findings: Secondary | ICD-10-CM
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
## 2021-12-20 NOTE — Patient Instructions (Signed)

## 2022-01-22 ENCOUNTER — Encounter (HOSPITAL_COMMUNITY): Payer: Self-pay | Admitting: Physical Therapy

## 2022-01-22 NOTE — Therapy (Unsigned)
[facility] [address] [place], [place], [address] Phone: [phone]   Fax:  [phone]  Patient Details  Name: Amy Atkins MRN: [id number] Date of Birth: 01-Oct-1931 Referring Provider:  No ref. provider found  Encounter Date: 01/22/2022 PHYSICAL THERAPY DISCHARGE SUMMARY  Visits from Start of Care: 13  Current functional level related to goals / functional outcomes: Unknown as pt did not return    Remaining deficits: Unknown as pt did not return      Education / Equipment: HEP   Patient agrees to discharge. Patient goals were Unknown as pt did not return   . Patient is being discharged due to not returning since the last visit.  Virgina Organ, PT CLT [phone]  01/22/2022, 1:13 PM  [facility] [address] [place], [place], [address] Phone: [phone]   Fax:  [phone]

## 2022-02-21 ENCOUNTER — Ambulatory Visit (INDEPENDENT_AMBULATORY_CARE_PROVIDER_SITE_OTHER): Payer: Medicare Other | Admitting: Nurse Practitioner

## 2022-02-21 VITALS — BP 174/86 | HR 88 | Temp 97.7°F | Ht 62.0 in | Wt 188.5 lb

## 2022-02-21 DIAGNOSIS — K0889 Other specified disorders of teeth and supporting structures: Secondary | ICD-10-CM | POA: Insufficient documentation

## 2022-02-21 DIAGNOSIS — N3941 Urge incontinence: Secondary | ICD-10-CM

## 2022-02-21 DIAGNOSIS — Z0000 Encounter for general adult medical examination without abnormal findings: Secondary | ICD-10-CM | POA: Diagnosis not present

## 2022-02-21 DIAGNOSIS — R030 Elevated blood-pressure reading, without diagnosis of hypertension: Secondary | ICD-10-CM

## 2022-02-21 NOTE — Assessment & Plan Note (Signed)
Chronic, at home blood pressure reading much improved compared to today's visit.  Patient remains asymptomatic.  For now we will remain off of antihypertensives.

## 2022-02-21 NOTE — Patient Instructions (Addendum)
Dr. Lynnette Caffey and Dr. Lynnette Caffey - Dentist office in [place], [place]. [phone] [address] [place] [address], [place], [place] [address]  [place] Help phone line: [phone]

## 2022-02-21 NOTE — Progress Notes (Signed)
Established Patient Office Visit  Subjective   Patient ID: Amy Atkins, female    DOB: 1931/02/10  Age: 89 y.o. MRN: [id number]  Chief Complaint  Patient presents with   Dental Problem    Reports that she has a bridge to the right side of her upper jaw involving 3 teeth that is loose.  Would like second opinion from a different dentist regarding neck steps in treatment.  Whitecoat hypertension: Has had elevated blood pressure in the office consistently but when checking blood pressure at home is much better.  She brings in pictures today of last blood pressure reading at home which shows 121/78.  Urge incontinence: Has intermittent urge incontinence first thing in the morning only.    Review of Systems  Eyes:  Negative for blurred vision.  Respiratory:  Negative for shortness of breath.   Cardiovascular:  Negative for chest pain.  Neurological:  Negative for headaches.      Objective:     BP (!) 174/86   Pulse 88   Temp 97.7 F (36.5 C) (Oral)   Ht 5\' 2"  (1.575 m)   Wt 188 lb 8 oz (85.5 kg)   SpO2 95%   BMI 34.48 kg/m  BP Readings from Last 3 Encounters:  02/21/22 (!) 174/86  08/16/21 130/72  06/13/21 130/82   Wt Readings from Last 3 Encounters:  02/21/22 188 lb 8 oz (85.5 kg)  08/16/21 183 lb (83 kg)  06/13/21 183 lb (83 kg)      Physical Exam Vitals reviewed.  Constitutional:      General: She is not in acute distress.    Appearance: Normal appearance.  HENT:     Head: Normocephalic and atraumatic.  Neck:     Vascular: No carotid bruit.  Cardiovascular:     Rate and Rhythm: Normal rate and regular rhythm.     Pulses: Normal pulses.     Heart sounds: Normal heart sounds.  Pulmonary:     Effort: Pulmonary effort is normal.     Breath sounds: Normal breath sounds.  Skin:    General: Skin is warm and dry.  Neurological:     General: No focal deficit present.     Mental Status: She is alert and oriented to person, place, and time.  Psychiatric:         Mood and Affect: Mood normal.        Behavior: Behavior normal.        Judgment: Judgment normal.      No results found for any visits on 02/21/22.    The ASCVD Risk score (Arnett DK, et al., 2019) failed to calculate for the following reasons:   The 2019 ASCVD risk score is only valid for ages 59 to 64    Assessment & Plan:   Problem List Items Addressed This Visit       Cardiovascular and Mediastinum   White coat syndrome without diagnosis of hypertension    Chronic, at home blood pressure reading much improved compared to today's visit.  Patient remains asymptomatic.  For now we will remain off of antihypertensives.        Digestive   Loose, teeth - Primary    Recommended second opinion with dentist.  She is provided with name and phone number to Dr. Lynnette Caffey and Dr. Lynnette Caffey dentist and Heather Roberts.        Other   Urge incontinence of urine    Appears to only occur in the morning, not causing significant  discomfort.  Recommend against excessive caffeine use and encouraged to let me know if symptoms worsen.       No follow-ups on file.    Ailene Ards, NP

## 2022-02-21 NOTE — Assessment & Plan Note (Signed)
Appears to only occur in the morning, not causing significant discomfort.  Recommend against excessive caffeine use and encouraged to let me know if symptoms worsen.

## 2022-02-21 NOTE — Assessment & Plan Note (Addendum)
Recommended second opinion with dentist.  She is provided with name and phone number to Dr. Lynnette Caffey and Dr. Lynnette Caffey dentist and Heather Roberts.

## 2022-07-25 ENCOUNTER — Encounter: Payer: Self-pay | Admitting: Nurse Practitioner

## 2022-07-25 ENCOUNTER — Ambulatory Visit (INDEPENDENT_AMBULATORY_CARE_PROVIDER_SITE_OTHER): Payer: Medicare Other | Admitting: Nurse Practitioner

## 2022-07-25 VITALS — BP 150/82 | HR 77 | Temp 97.7°F | Ht 62.0 in | Wt 193.0 lb

## 2022-07-25 DIAGNOSIS — Z6835 Body mass index (BMI) 35.0-35.9, adult: Secondary | ICD-10-CM | POA: Diagnosis not present

## 2022-07-25 DIAGNOSIS — R600 Localized edema: Secondary | ICD-10-CM | POA: Insufficient documentation

## 2022-07-25 DIAGNOSIS — R0601 Orthopnea: Secondary | ICD-10-CM

## 2022-07-25 NOTE — Assessment & Plan Note (Signed)
Labs ordered, further recommendations may be made based upon his results. 

## 2022-07-25 NOTE — Assessment & Plan Note (Signed)
Referral to cardiology and cardiac echocardiogram ordered today.  Labs also ordered for further evaluation, further recommendations may be made based on these results.

## 2022-07-25 NOTE — Assessment & Plan Note (Signed)
Chronic, getting progressively worse Concern for heart failure. Will refer to cardiology for further evaluation and order cardiac echocardiogram as well. Labs ordered for further evaluation, further recommendations may be made based upon the results.

## 2022-07-25 NOTE — Progress Notes (Signed)
Established Patient Office Visit  Subjective   Patient ID: Amy Atkins, female    DOB: 1931-12-31  Age: 89 y.o. MRN: [id number]  Chief Complaint  Patient presents with   Medical Management of Chronic Issues    Follow up and swelling in the ankle going for two month now     Bilateral lower extremity swelling: Has also noticed increased shortness of breath with exertion, is starting to sleep with multiple pillows at night due to mild orthopnea.  She does try to elevate legs is much as possible with some improvement in the swelling.  Per chart review had echocardiogram around 10 years ago which did show grade 1 diastolic dysfunction.  Not currently on any medications.  She does have whitecoat syndrome, and at home blood pressures are much better than readings that are collected in the office.  Has seen cardiology in the past but does not follow-up with them routinely.  She reports that she has known venous insufficiency.  No wounds currently.    Review of Systems  Respiratory:  Positive for shortness of breath (with exertion).   Cardiovascular:  Positive for leg swelling. Negative for chest pain.      Objective:     BP (!) 150/82   Pulse 77   Temp 97.7 F (36.5 C) (Temporal)   Ht 5\' 2"  (1.575 m)   Wt 193 lb (87.5 kg)   SpO2 97%   BMI 35.30 kg/m  BP Readings from Last 3 Encounters:  07/25/22 (!) 150/82  02/21/22 (!) 174/86  08/16/21 130/72   Wt Readings from Last 3 Encounters:  07/25/22 193 lb (87.5 kg)  02/21/22 188 lb 8 oz (85.5 kg)  08/16/21 183 lb (83 kg)      Physical Exam Vitals reviewed.  Constitutional:      General: She is not in acute distress.    Appearance: Normal appearance.  HENT:     Head: Normocephalic and atraumatic.  Neck:     Vascular: No carotid bruit.  Cardiovascular:     Rate and Rhythm: Normal rate and regular rhythm.     Pulses: Normal pulses.     Heart sounds: Normal heart sounds.  Pulmonary:     Effort: Pulmonary effort is normal.      Breath sounds: Normal breath sounds.  Musculoskeletal:     Right lower leg: 1+ Pitting Edema present.     Left lower leg: 1+ Pitting Edema present.  Skin:    General: Skin is warm and dry.  Neurological:     General: No focal deficit present.     Mental Status: She is alert and oriented to person, place, and time.  Psychiatric:        Mood and Affect: Mood normal.        Behavior: Behavior normal.        Judgment: Judgment normal.      No results found for any visits on 07/25/22.    The ASCVD Risk score (Arnett DK, et al., 2019) failed to calculate for the following reasons:   The 2019 ASCVD risk score is only valid for ages 27 to 34    Assessment & Plan:   Problem List Items Addressed This Visit       Other   Bilateral lower extremity edema - Primary    Chronic, getting progressively worse Concern for heart failure. Will refer to cardiology for further evaluation and order cardiac echocardiogram as well. Labs ordered for further evaluation, further recommendations may be  made based upon the results.      Relevant Orders   Ambulatory referral to Cardiology   CBC   Comprehensive metabolic panel   ECHOCARDIOGRAM COMPLETE   Lipid panel   TSH   Orthopnea    Referral to cardiology and cardiac echocardiogram ordered today.  Labs also ordered for further evaluation, further recommendations may be made based on these results.      Relevant Orders   Ambulatory referral to Cardiology   CBC   Comprehensive metabolic panel   ECHOCARDIOGRAM COMPLETE   Lipid panel   TSH   Class 2 severe obesity with serious comorbidity and body mass index (BMI) of 35.0 to 35.9 in adult [facility])    Labs ordered, further recommendations may be made based upon his results       Relevant Orders   CBC   Comprehensive metabolic panel   Lipid panel   TSH    Return in about 3 months (around 10/25/2022) for F/U with Rickardo Brinegar.  Total time spent on encounter today was 30 minutes including  face-to-face evaluation, review of previous records, and development/discussion of treatment plan.   Elenore Paddy, NP

## 2022-09-18 ENCOUNTER — Encounter (INDEPENDENT_AMBULATORY_CARE_PROVIDER_SITE_OTHER): Payer: Self-pay

## 2022-10-30 ENCOUNTER — Ambulatory Visit: Payer: Medicare Other | Admitting: Nurse Practitioner

## 2022-11-17 ENCOUNTER — Other Ambulatory Visit: Payer: Self-pay | Admitting: Nurse Practitioner

## 2022-11-17 DIAGNOSIS — R0601 Orthopnea: Secondary | ICD-10-CM | POA: Diagnosis not present

## 2022-11-17 DIAGNOSIS — Z6835 Body mass index (BMI) 35.0-35.9, adult: Secondary | ICD-10-CM | POA: Diagnosis not present

## 2022-11-17 DIAGNOSIS — R600 Localized edema: Secondary | ICD-10-CM | POA: Diagnosis not present

## 2022-11-18 LAB — CBC
HCT: 38.1 % (ref 35.0–45.0)
Hemoglobin: 12.7 g/dL (ref 11.7–15.5)
MCH: 30.1 pg (ref 27.0–33.0)
MCHC: 33.3 g/dL (ref 32.0–36.0)
MCV: 90.3 fL (ref 80.0–100.0)
MPV: 12.7 fL — ABNORMAL HIGH (ref 7.5–12.5)
Platelets: 187 10*3/uL (ref 140–400)
RBC: 4.22 10*6/uL (ref 3.80–5.10)
RDW: 13.4 % (ref 11.0–15.0)
WBC: 8.2 10*3/uL (ref 3.8–10.8)

## 2022-11-18 LAB — COMPREHENSIVE METABOLIC PANEL
AG Ratio: 1.9 (calc) (ref 1.0–2.5)
ALT: 13 U/L (ref 6–29)
AST: 16 U/L (ref 10–35)
Albumin: 4.5 g/dL (ref 3.6–5.1)
Alkaline phosphatase (APISO): 60 U/L (ref 37–153)
BUN/Creatinine Ratio: 22 (calc) (ref 6–22)
BUN: 24 mg/dL (ref 7–25)
CO2: 28 mmol/L (ref 20–32)
Calcium: 9.8 mg/dL (ref 8.6–10.4)
Chloride: 105 mmol/L (ref 98–110)
Creat: 1.07 mg/dL — ABNORMAL HIGH (ref 0.60–0.95)
Globulin: 2.4 g/dL (ref 1.9–3.7)
Glucose, Bld: 90 mg/dL (ref 65–99)
Potassium: 5.3 mmol/L (ref 3.5–5.3)
Sodium: 139 mmol/L (ref 135–146)
Total Bilirubin: 0.5 mg/dL (ref 0.2–1.2)
Total Protein: 6.9 g/dL (ref 6.1–8.1)

## 2022-11-18 LAB — LIPID PANEL
Cholesterol: 253 mg/dL — ABNORMAL HIGH (ref <200)
HDL: 66 mg/dL (ref >=50)
LDL Cholesterol (Calc): 161 mg/dL — ABNORMAL HIGH
Non-HDL Cholesterol (Calc): 187 mg/dL — ABNORMAL HIGH (ref <130)
Total CHOL/HDL Ratio: 3.8 (calc) (ref <5.0)
Triglycerides: 135 mg/dL (ref <150)

## 2022-11-18 LAB — TSH: TSH: 3.11 m[IU]/L (ref 0.40–4.50)

## 2022-12-03 ENCOUNTER — Ambulatory Visit: Payer: Medicare Other | Admitting: Nurse Practitioner

## 2023-01-22 ENCOUNTER — Encounter: Payer: Self-pay | Admitting: Nurse Practitioner

## 2023-01-22 ENCOUNTER — Ambulatory Visit: Payer: Medicare Other | Admitting: Nurse Practitioner

## 2023-01-22 VITALS — BP 138/84 | HR 85 | Temp 98.0°F | Ht 62.0 in | Wt 197.1 lb

## 2023-01-22 DIAGNOSIS — R600 Localized edema: Secondary | ICD-10-CM

## 2023-01-22 DIAGNOSIS — E785 Hyperlipidemia, unspecified: Secondary | ICD-10-CM | POA: Diagnosis not present

## 2023-01-22 NOTE — Assessment & Plan Note (Signed)
Chronic I am agreeable for patient to try red yeast rice, she should follow-up in about 3 months at which point we will check fasting lipid panel and metabolic panel.  Patient reports understanding. Also encouraged her to continue focusing on dietary modification as she has already been doing.

## 2023-01-22 NOTE — Assessment & Plan Note (Signed)
Chronic, seems to have improved Discussed that if she experiences chest pain, cardiac palpitations, worsening swelling, orthopnea, PND, exertional fatigue that she should be evaluated by cardiology and probably should also undergo cardiac echocardiogram.  She was encouraged to let me know if the symptoms arise.  She reports her understanding.

## 2023-01-22 NOTE — Progress Notes (Signed)
   Established Patient Office Visit  Subjective   Patient ID: Amy Atkins, female    DOB: 03/04/1931  Age: 89 y.o. MRN: [id number]  Chief Complaint  Patient presents with   Hyperlipidemia    Hyperlipidemia: Last LDL 161.  Patient is hesitant to take cholesterol-lowering medication.  Would like to consider starting red yeast rice as supplement to lower cholesterol.  Has started taking a fish oil.  Has cut back on intake of butter, cheese, and half-and-half.  Trying to drink more water.  Also reports having restarted her D3, taking 4000 IUs by mouth daily.  Orthopnea/lower extremity swelling: Reports swelling has much improved since last time I have seen her.  She reports that she is not experiencing chest pain, palpitations, orthopnea, PND, or exertional fatigue.  Previously was recommended to undergo echocardiogram and follow-up with cardiology.  Patient has not done this and prefers to not pursue workup at this time.    ROS: see HPI    Objective:     BP 138/84 (Cuff Size: Large)   Pulse 85   Temp 98 F (36.7 C) (Temporal)   Ht 5\' 2"  (1.575 m)   Wt 197 lb 2 oz (89.4 kg)   SpO2 97%   BMI 36.05 kg/m    Physical Exam Vitals reviewed.  Constitutional:      General: She is not in acute distress.    Appearance: Normal appearance.  HENT:     Head: Normocephalic and atraumatic.  Cardiovascular:     Rate and Rhythm: Normal rate and regular rhythm.     Pulses: Normal pulses.     Heart sounds: Normal heart sounds.  Pulmonary:     Effort: Pulmonary effort is normal.     Breath sounds: Normal breath sounds.  Skin:    General: Skin is warm and dry.  Neurological:     General: No focal deficit present.     Mental Status: She is alert and oriented to person, place, and time.  Psychiatric:        Mood and Affect: Mood normal.        Behavior: Behavior normal.        Judgment: Judgment normal.      No results found for any visits on 01/22/23.    The ASCVD Risk score  (Arnett DK, et al., 2019) failed to calculate for the following reasons:   The 2019 ASCVD risk score is only valid for ages 57 to 57    Assessment & Plan:   Problem List Items Addressed This Visit       Other   Bilateral lower extremity edema - Primary   Chronic, seems to have improved Discussed that if she experiences chest pain, cardiac palpitations, worsening swelling, orthopnea, PND, exertional fatigue that she should be evaluated by cardiology and probably should also undergo cardiac echocardiogram.  She was encouraged to let me know if the symptoms arise.  She reports her understanding.      HLD (hyperlipidemia)   Chronic I am agreeable for patient to try red yeast rice, she should follow-up in about 3 months at which point we will check fasting lipid panel and metabolic panel.  Patient reports understanding. Also encouraged her to continue focusing on dietary modification as she has already been doing.       **Consider checking vitamin D level at next office visit as well.  Return in about 3 months (around 04/22/2023) for F/U with Maralyn Sago.    Elenore Paddy, NP

## 2023-04-23 ENCOUNTER — Ambulatory Visit: Payer: Medicare Other | Admitting: Nurse Practitioner

## 2023-04-23 VITALS — BP 140/88 | HR 77 | Temp 97.8°F | Ht 62.0 in | Wt 196.0 lb

## 2023-04-23 DIAGNOSIS — E785 Hyperlipidemia, unspecified: Secondary | ICD-10-CM | POA: Diagnosis not present

## 2023-04-23 DIAGNOSIS — R030 Elevated blood-pressure reading, without diagnosis of hypertension: Secondary | ICD-10-CM

## 2023-04-23 DIAGNOSIS — E2839 Other primary ovarian failure: Secondary | ICD-10-CM | POA: Diagnosis not present

## 2023-04-23 NOTE — Assessment & Plan Note (Signed)
 Chronic Patient has declined treatment options at this time, she will continue focusing on healthy lifestyle Consider checking lipid panel at next serum check.

## 2023-04-23 NOTE — Assessment & Plan Note (Signed)
 Chronic, stable At home readings much better compared to in office readings.  For now patient to continue off of antihypertensive therapy.

## 2023-04-23 NOTE — Assessment & Plan Note (Signed)
 Chronic Order DEXA scan for further evaluation.  Further recommendations may be made based on his results.

## 2023-04-23 NOTE — Progress Notes (Signed)
 Established Patient Office Visit  Subjective   Patient ID: Amy Atkins, female    DOB: 09-Apr-1931  Age: 89 y.o. MRN: [id number]  Chief Complaint  Patient presents with   Hyperlipidemia    Hyperlipidemia: Chronic, patient is decided to discontinue red yeast rice.  Does not want to take additional medication to manage cholesterol, last LDL 161.  She continues on omega-3 fish oil.   Estrogen deficiency due to menopause: Per chart review does not appear the patient has undergone DEXA scan, she does fall at times.  She would like to know if she has osteoporosis but she would consider treatment if this is present.  Whitecoat syndrome: She also brings in pictures of her at home blood pressure measurements.  Generally blood pressure runs between 120s to 130s/70s to 80s    ROS: see HPI    Objective:     BP (!) 140/88   Pulse 77   Temp 97.8 F (36.6 C) (Temporal)   Ht 5\' 2"  (1.575 m)   Wt 196 lb (88.9 kg)   SpO2 98%   BMI 35.85 kg/m  BP Readings from Last 3 Encounters:  04/23/23 (!) 140/88  01/22/23 138/84  07/25/22 (!) 150/82   Wt Readings from Last 3 Encounters:  04/23/23 196 lb (88.9 kg)  01/22/23 197 lb 2 oz (89.4 kg)  07/25/22 193 lb (87.5 kg)      Physical Exam Vitals reviewed.  Constitutional:      General: She is not in acute distress.    Appearance: Normal appearance.  HENT:     Head: Normocephalic and atraumatic.  Neck:     Vascular: No carotid bruit.  Cardiovascular:     Rate and Rhythm: Normal rate and regular rhythm.     Pulses: Normal pulses.     Heart sounds: Normal heart sounds.  Pulmonary:     Effort: Pulmonary effort is normal.     Breath sounds: Normal breath sounds.  Skin:    General: Skin is warm and dry.  Neurological:     General: No focal deficit present.     Mental Status: She is alert and oriented to person, place, and time.  Psychiatric:        Mood and Affect: Mood normal.        Behavior: Behavior normal.        Judgment:  Judgment normal.      No results found for any visits on 04/23/23.    The ASCVD Risk score (Arnett DK, et al., 2019) failed to calculate for the following reasons:   The 2019 ASCVD risk score is only valid for ages 16 to 74    Assessment & Plan:   Problem List Items Addressed This Visit       Cardiovascular and Mediastinum   White coat syndrome without diagnosis of hypertension   Chronic, stable At home readings much better compared to in office readings.  For now patient to continue off of antihypertensive therapy.        Other   HLD (hyperlipidemia) - Primary   Chronic Patient has declined treatment options at this time, she will continue focusing on healthy lifestyle Consider checking lipid panel at next serum check.      Estrogen deficiency   Chronic Order DEXA scan for further evaluation.  Further recommendations may be made based on his results.      Relevant Orders   DG Bone Density    Return in about 7 months (around 11/23/2023) for  F/U with Maralyn Sago.    Elenore Paddy, NP

## 2023-07-03 ENCOUNTER — Telehealth: Payer: Self-pay

## 2023-07-03 NOTE — Telephone Encounter (Signed)
 Copied from CRM [id number]. Topic: Clinical - Medical Advice >> Jul 03, 2023  1:48 PM Deaijah H wrote: Reason for CRM: Patient would like to speak with Adella Agee regarding an healthcare issue that has come up. Please call [phone]

## 2023-07-03 NOTE — Telephone Encounter (Signed)
 Spoke to pt, pt stated that on the 15th of May, she had some pain from the back to her leg, and ever since then she has not been able  straighten her back and while walking, use most of her weight on the walker. Pt also stated, five days later she had shingle. Advise pt that she need to be seen, she will be calling back on Monday to schedule virtual visit

## 2023-07-06 ENCOUNTER — Telehealth (INDEPENDENT_AMBULATORY_CARE_PROVIDER_SITE_OTHER): Admitting: Family Medicine

## 2023-07-06 ENCOUNTER — Encounter: Payer: Self-pay | Admitting: Family Medicine

## 2023-07-06 DIAGNOSIS — B0229 Other postherpetic nervous system involvement: Secondary | ICD-10-CM

## 2023-07-06 DIAGNOSIS — M5441 Lumbago with sciatica, right side: Secondary | ICD-10-CM | POA: Diagnosis not present

## 2023-07-06 MED ORDER — TRAMADOL HCL 50 MG PO TABS: 25.0000 mg | ORAL_TABLET | Freq: Three times a day (TID) | ORAL | 0 refills | Status: DC | PRN

## 2023-07-06 NOTE — Progress Notes (Signed)
 Virtual Visit via Video Note  I connected with Amy Atkins on 07/06/23 at 11:20 AM EDT by a video enabled telemedicine application and verified that I am speaking with the correct person using two identifiers.  Patient Location: Home Provider Location: Office/Clinic  I discussed the limitations, risks, security, and privacy concerns of performing an evaluation and management service by video and the availability of in person appointments. I also discussed with the patient that there may be a patient responsible charge related to this service. The patient expressed understanding and agreed to proceed.  Subjective: PCP: [name], NP  Chief Complaint  Patient presents with   Acute Visit    Back pains, moving down her right leg, ongoing since May 15th. Shingles flared up 5 days later, 3rd flare in 2 years   HPI 89 year old female presents for A/V visit today to discuss right low back pain that radiates down her leg. Also reports that she had a shingles outbreak in the right breast around the right side to the right back.  States that the blisters are drying out and the pain has improved. Has been taking Aleve and Tylenol  for low back pain with no relief. Has taken hydrocodone  in the past and this medication was way too strong for her. Denies numbness, tingling, loss of strength in the extremity. Has been in physical therapy for the same with little relief overall. Denies other concerns today.  ROS: Per HPI  Current Outpatient Medications:    Cholecalciferol (VITAMIN D-3 PO), Take by mouth. 4000IUs/day, Disp: , Rfl:    Omega-3 Fatty Acids (FISH OIL PO), Take by mouth., Disp: , Rfl:    traMADol (ULTRAM) 50 MG tablet, Take 0.5 tablets (25 mg total) by mouth every 8 (eight) hours as needed for up to 5 days., Disp: 8 tablet, Rfl: 0  Observations/Objective: There were no vitals filed for this visit. Physical Exam Vitals and nursing note reviewed.  Constitutional:      General:  She is not in acute distress. HENT:     Head: Normocephalic and atraumatic.  Eyes:     Extraocular Movements: Extraocular movements intact.  Pulmonary:     Effort: Pulmonary effort is normal.  Musculoskeletal:     Cervical back: Normal range of motion.  Neurological:     General: No focal deficit present.     Mental Status: She is alert and oriented to person, place, and time.  Psychiatric:        Mood and Affect: Mood normal.        Behavior: Behavior normal.     Assessment and Plan: Postherpetic neuralgia -     traMADol HCl; Take 0.5 tablets (25 mg total) by mouth every 8 (eight) hours as needed for up to 5 days.  Dispense: 8 tablet; Refill: 0  Acute right-sided low back pain with right-sided sciatica -     traMADol HCl; Take 0.5 tablets (25 mg total) by mouth every 8 (eight) hours as needed for up to 5 days.  Dispense: 8 tablet; Refill: 0    Follow Up Instructions: No follow-ups on file.   I discussed the assessment and treatment plan with the patient. The patient was provided an opportunity to ask questions, and all were answered. The patient agreed with the plan and demonstrated an understanding of the instructions.   The patient was advised to call back or seek an in-person evaluation if the symptoms worsen or if the condition fails to improve as anticipated.  The  above assessment and management plan was discussed with the patient. The patient verbalized understanding of and has agreed to the management plan.   Wellington Half, FNP

## 2023-07-13 ENCOUNTER — Other Ambulatory Visit: Payer: Self-pay | Admitting: Family Medicine

## 2023-07-13 ENCOUNTER — Other Ambulatory Visit: Payer: Self-pay | Admitting: Nurse Practitioner

## 2023-07-13 DIAGNOSIS — M5441 Lumbago with sciatica, right side: Secondary | ICD-10-CM

## 2023-07-13 DIAGNOSIS — B0229 Other postherpetic nervous system involvement: Secondary | ICD-10-CM

## 2023-07-13 NOTE — Telephone Encounter (Signed)
 Copied from CRM [id number]. Topic: Clinical - Medication Refill >> Jul 13, 2023  3:29 PM Leah C wrote: Medication: traMADol  (ULTRAM ) 50 MG tablet [[id number]]  Has the patient contacted their pharmacy? [facility] contacted and stated that they didn't receive the refill request and asked if it could be sent over again.  (Agent: If no, request that the patient contact the pharmacy for the refill. If patient does not wish to contact the pharmacy document the reason why and proceed with request.) (Agent: If yes, when and what did the pharmacy advise?)  [id number] - [place], [place] - [address] [place] [address] Phone: [phone] Fax: [phone]  Is this the correct pharmacy for this prescription? Yes If no, delete pharmacy and type the correct one.   Has the prescription been filled recently? Yes  Is the patient out of the medication? Yes  Has the patient been seen for an appointment in the last year OR does the patient have an upcoming appointment? Yes  Can we respond through MyChart? Yes  Agent: Please be advised that Rx refills may take up to 3 business days. We ask that you follow-up with your pharmacy.

## 2023-11-20 ENCOUNTER — Ambulatory Visit: Payer: Self-pay

## 2023-11-20 NOTE — Telephone Encounter (Signed)
 FYI Only or Action Required?: FYI only for provider.  Patient was last seen in primary care on 07/06/2023 by Alvia Corean CROME, FNP.  Called Nurse Triage reporting Rash.  Symptoms began several months ago.  Interventions attempted: OTC medications: anti itch creams.  Symptoms are: gradually worsening.  Triage Disposition: See PCP When Office is Open (Within 3 Days)  Patient/caregiver understands and will follow disposition?: Yes        Copied from CRM #[id number]. Topic: Clinical - Red Word Triage >> Nov 20, 2023  4:28 PM Harlene ORN wrote: Red Word that prompted transfer to Nurse Triage: (requesting a virtual visit with his PCP) has a rash that keeps coming back dry and itchy red mound  on her wrist top of her hands, and spreading to her legs  Ankles are swelling Reason for Disposition  Mild widespread rash  (Exception: Heat rash lasting 3 days or less.)  Answer Assessment - Initial Assessment Questions Red mound 3/4 inch  Wrist back of  hands, and legs    1. APPEARANCE of RASH: What does the rash look like? (e.g., blisters, dry flaky skin, red spots, redness, sores)     Dry mound 2. SIZE: How big are the spots? (e.g., tip of pen, eraser, coin; inches, centimeters)     3/4 inch 3. LOCATION: Where is the rash located?     Back of hands, wrists, and legs 4. COLOR: What color is the rash? (Note: It is difficult to assess rash color in people with darker-colored skin. When this situation occurs, simply ask the caller to describe what they see.)     red 5. ONSET: When did the rash begin?     June 6. FEVER: Do you have a fever? If Yes, ask: What is your temperature, how was it measured, and when did it start?     no 7. ITCHING: Does the rash itch? If Yes, ask: How bad is the itch? (Scale 1-10; or mild, moderate, severe)     Itches and some times goes quite a while without itching 8. CAUSE: What do you think is causing the rash?     unknown 9.  MEDICINE FACTORS: Have you started any new medicines within the last 2 weeks? (e.g., antibiotics)       10. OTHER SYMPTOMS: Do you have any other symptoms? (e.g., dizziness, headache, sore throat, joint pain)       Ankle swelling  Protocols used: Rash or Redness - [facility]

## 2023-11-24 ENCOUNTER — Ambulatory Visit: Payer: Self-pay | Admitting: Physician Assistant

## 2023-12-02 NOTE — Telephone Encounter (Signed)
 Left voice mail for pt to call our office and schedule an appointment to be seen for this issue.

## 2024-01-21 ENCOUNTER — Ambulatory Visit (INDEPENDENT_AMBULATORY_CARE_PROVIDER_SITE_OTHER): Admitting: Nurse Practitioner

## 2024-01-21 VITALS — BP 150/84 | HR 82 | Temp 97.9°F | Ht 62.0 in | Wt 196.0 lb

## 2024-01-21 DIAGNOSIS — R030 Elevated blood-pressure reading, without diagnosis of hypertension: Secondary | ICD-10-CM

## 2024-01-21 DIAGNOSIS — R21 Rash and other nonspecific skin eruption: Secondary | ICD-10-CM

## 2024-01-21 DIAGNOSIS — B0229 Other postherpetic nervous system involvement: Secondary | ICD-10-CM

## 2024-01-21 DIAGNOSIS — E785 Hyperlipidemia, unspecified: Secondary | ICD-10-CM | POA: Diagnosis not present

## 2024-01-21 DIAGNOSIS — M7989 Other specified soft tissue disorders: Secondary | ICD-10-CM | POA: Diagnosis not present

## 2024-01-21 LAB — BASIC METABOLIC PANEL WITH GFR
BUN: 21 mg/dL (ref 6–23)
CO2: 29 meq/L (ref 19–32)
Calcium: 9.6 mg/dL (ref 8.4–10.5)
Chloride: 104 meq/L (ref 96–112)
Creatinine, Ser: 0.95 mg/dL (ref 0.40–1.20)
GFR: 52.63 mL/min — ABNORMAL LOW (ref >60.00)
Glucose, Bld: 96 mg/dL (ref 70–99)
Potassium: 4.4 meq/L (ref 3.5–5.1)
Sodium: 140 meq/L (ref 135–145)

## 2024-01-21 LAB — LIPID PANEL
Cholesterol: 242 mg/dL — ABNORMAL HIGH (ref 28–200)
HDL: 63.5 mg/dL (ref >39.00)
LDL Cholesterol: 144 mg/dL — ABNORMAL HIGH (ref 10–99)
NonHDL: 178.4
Total CHOL/HDL Ratio: 4
Triglycerides: 172 mg/dL — ABNORMAL HIGH (ref 10.0–149.0)
VLDL: 34.4 mg/dL (ref 0.0–40.0)

## 2024-01-21 LAB — COMPREHENSIVE METABOLIC PANEL WITH GFR
ALT: 13 U/L (ref 3–35)
AST: 15 U/L (ref 5–37)
Albumin: 4.5 g/dL (ref 3.5–5.2)
Alkaline Phosphatase: 61 U/L (ref 39–117)
BUN: 21 mg/dL (ref 6–23)
CO2: 29 meq/L (ref 19–32)
Calcium: 9.6 mg/dL (ref 8.4–10.5)
Chloride: 104 meq/L (ref 96–112)
Creatinine, Ser: 0.95 mg/dL (ref 0.40–1.20)
GFR: 52.63 mL/min — ABNORMAL LOW (ref >60.00)
Glucose, Bld: 96 mg/dL (ref 70–99)
Potassium: 4.4 meq/L (ref 3.5–5.1)
Sodium: 140 meq/L (ref 135–145)
Total Bilirubin: 0.5 mg/dL (ref 0.2–1.2)
Total Protein: 7.3 g/dL (ref 6.0–8.3)

## 2024-01-21 MED ORDER — CLOTRIMAZOLE 1 % EX CREA: 1.0000 | TOPICAL_CREAM | Freq: Two times a day (BID) | CUTANEOUS | 0 refills | Status: AC

## 2024-01-21 NOTE — Assessment & Plan Note (Signed)
 Chronic, slightly worse.  Due to patient's history of unprovoked DVT will order stat ultrasound of lower extremities.  If negative likely this is venous insufficiency and will be encouraged to continue using compression stockings and elevate legs for management.

## 2024-01-21 NOTE — Assessment & Plan Note (Signed)
 Etiology unclear regarding rash on hands.  Possibly fungal?  Treat with clotrimazole  ointment twice a day as well as Eucerin eczema cream twice a day.  If rash does not improve reach out to office which point we could trial steroid cream.  Will also refer patient to dermatology for further evaluation.  Lesion on right lower extremity appears possibly consistent with actinic keratoses will refer to dermatology for further evaluation.

## 2024-01-21 NOTE — Progress Notes (Signed)
 Established Patient Office Visit  Subjective   Patient ID: Amy Atkins, female    DOB: 1931-04-13  Age: 89 y.o. MRN: [id number]  Chief Complaint  Patient presents with   Hyperlipidemia   Hyperlipidemia/hypertension: Hyperlipidemia is chronic but has been stable.  Last LDL greater than 160.  Patient has historically been hesitant to try statin therapy.  She was considering red yeast rice supplementation but decided not to start this due to concerns of possible side effects.  She has known whitecoat syndrome but monitors blood pressure at home.  Has recently had multiple bouts of herpes zoster which has resulted in postherpetic neuralgia and the pain has resulted in higher than normal blood pressure readings.  She reports that postherpetic neuralgia is much improved at this time and blood pressure readings seem to be improving at home.  She reports systolic readings at home between 115-140s.  Rash: Has had rash on bilateral hands as well as left lower extremity for a few months.  Has been using a goat milk-based moisturizer.  Despite this rash has been persistent and is slightly pruritic.  It is flat and red.  Often has multiple circular lesions.   Venous insufficiency/bilateral lower extremity swelling: Has chronic venous insufficiency and chronic bilateral lower extremity swelling.  Reports recently swelling is worsened.  She has history of unprovoked DVT in the left leg.  Due to bleeding risks patient is not on long-term anticoagulation.     Review of Systems  Respiratory:  Negative for cough and shortness of breath.   Cardiovascular:  Negative for chest pain, palpitations and leg swelling.      Objective:     BP (!) 150/84   Pulse 82   Temp 97.9 F (36.6 C) (Temporal)   Ht 5' 2 (1.575 m)   Wt 196 lb (88.9 kg)   SpO2 97%   BMI 35.85 kg/m    Physical Exam Vitals reviewed.  Constitutional:      General: She is not in acute distress.    Appearance: Normal appearance.   HENT:     Head: Normocephalic and atraumatic.  Neck:     Vascular: No carotid bruit.  Cardiovascular:     Rate and Rhythm: Normal rate and regular rhythm.     Pulses: Normal pulses.     Heart sounds: Normal heart sounds.  Pulmonary:     Effort: Pulmonary effort is normal.     Breath sounds: Normal breath sounds.  Musculoskeletal:     Right lower leg: Edema present.     Left lower leg: Edema present.  Skin:    General: Skin is warm and dry.      Neurological:     General: No focal deficit present.     Mental Status: She is alert and oriented to person, place, and time.  Psychiatric:        Mood and Affect: Mood normal.        Behavior: Behavior normal.        Judgment: Judgment normal.      No results found for any visits on 01/21/24.    The ASCVD Risk score (Arnett DK, et al., 2019) failed to calculate for the following reasons:   The 2019 ASCVD risk score is only valid for ages 11 to 58   * - Cholesterol units were assumed    Assessment & Plan:   Problem List Items Addressed This Visit       Cardiovascular and Mediastinum   White coat syndrome  without diagnosis of hypertension   Chronic Patient encouraged to monitor blood pressure at home daily for the next 2 weeks.  If systolic blood pressure readings consistently greater than 140-145 I would recommend we start antihypertensive.  Patient reports understanding.        Musculoskeletal and Integument   Rash   Etiology unclear regarding rash on hands.  Possibly fungal?  Treat with clotrimazole  ointment twice a day as well as Eucerin eczema cream twice a day.  If rash does not improve reach out to office which point we could trial steroid cream.  Will also refer patient to dermatology for further evaluation.  Lesion on right lower extremity appears possibly consistent with actinic keratoses will refer to dermatology for further evaluation.      Relevant Medications   clotrimazole  (LOTRIMIN ) 1 % cream   Other  Relevant Orders   Ambulatory referral to Dermatology     Other   HLD (hyperlipidemia) - Primary   Chronic We again discussed risks versus benefits of cholesterol-lowering medication.  Patient would like to continue off of medication at this time but would like updated lipid panel.  Check lipid panel, further recommendations may be made based upon those results      Relevant Orders   Basic metabolic panel with GFR   Lipid panel   Comprehensive metabolic panel with GFR   Localized swelling of both lower extremities   Chronic, slightly worse.  Due to patient's history of unprovoked DVT will order stat ultrasound of lower extremities.  If negative likely this is venous insufficiency and will be encouraged to continue using compression stockings and elevate legs for management.      Relevant Orders   VAS US  LOWER EXTREMITY VENOUS (DVT)    Return in about 6 months (around 07/21/2024) for F/U with Nicole Defino.    Lauraine FORBES Pereyra, NP

## 2024-01-21 NOTE — Patient Instructions (Signed)
 Eucerin - Eczema cream

## 2024-01-21 NOTE — Assessment & Plan Note (Signed)
 Chronic We again discussed risks versus benefits of cholesterol-lowering medication.  Patient would like to continue off of medication at this time but would like updated lipid panel.  Check lipid panel, further recommendations may be made based upon those results

## 2024-01-21 NOTE — Assessment & Plan Note (Signed)
 Chronic Patient encouraged to monitor blood pressure at home daily for the next 2 weeks.  If systolic blood pressure readings consistently greater than 140-145 I would recommend we start antihypertensive.  Patient reports understanding.

## 2024-01-22 ENCOUNTER — Ambulatory Visit (HOSPITAL_COMMUNITY)
Admission: RE | Admit: 2024-01-22 | Discharge: 2024-01-22 | Disposition: A | Discharge status: Home / Self Care | Source: Ambulatory Visit | Attending: Nurse Practitioner | Admitting: Nurse Practitioner

## 2024-01-22 ENCOUNTER — Ambulatory Visit: Payer: Self-pay | Admitting: Nurse Practitioner

## 2024-01-22 DIAGNOSIS — M7989 Other specified soft tissue disorders: Secondary | ICD-10-CM | POA: Insufficient documentation

## 2024-02-02 ENCOUNTER — Ambulatory Visit

## 2024-02-19 ENCOUNTER — Ambulatory Visit

## 2024-02-19 VITALS — Ht 62.0 in | Wt 196.0 lb

## 2024-02-19 DIAGNOSIS — Z0000 Encounter for general adult medical examination without abnormal findings: Secondary | ICD-10-CM | POA: Diagnosis not present

## 2024-02-19 NOTE — Patient Instructions (Addendum)
 Amy Atkins,  Thank you for taking the time for your Medicare Wellness Visit. I appreciate your continued commitment to your health goals. Please review the care plan we discussed, and feel free to reach out if I can assist you further.  Please note that Annual Wellness Visits do not include a physical exam. Some assessments may be limited, especially if the visit was conducted virtually. If needed, we may recommend an in-person follow-up with your provider.  Ongoing Care Seeing your primary care provider every 3 to 6 months helps us  monitor your health and provide consistent, personalized care.   Referrals If a referral was made during today's visit and you haven't received any updates within two weeks, please contact the referred provider directly to check on the status.  Recommended Screenings:  Health Maintenance  Topic Date Due   Pneumococcal Vaccine for age over 84 (1 of 1 - PCV) Never done   Zoster (Shingles) Vaccine (1 of 2) Never done   Osteoporosis screening with Bone Density Scan  Never done   DTaP/Tdap/Td vaccine (2 - Tdap) 07/20/2022   Flu Shot  Never done   COVID-19 Vaccine (3 - 2025-26 season) 10/05/2023   Medicare Annual Wellness Visit  02/18/2025   Meningitis B Vaccine  Aged Out       12/20/2021    2:23 PM  Advanced Directives  Does Patient Have a Medical Advance Directive? Yes  Type of Estate Agent of [place];Living will  Does patient want to make changes to medical advance directive? No - Patient declined  Copy of Healthcare Power of Attorney in Chart? No - copy requested    Vision: Annual vision screenings are recommended for early detection of glaucoma, cataracts, and diabetic retinopathy. These exams can also reveal signs of chronic conditions such as diabetes and high blood pressure.  Dental: Annual dental screenings help detect early signs of oral cancer, gum disease, and other conditions linked to overall health, including heart  disease and diabetes.
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
## 2024-03-17 ENCOUNTER — Ambulatory Visit: Admitting: Physician Assistant

## 2024-07-21 ENCOUNTER — Ambulatory Visit: Admitting: Nurse Practitioner

## 2024-10-12 ENCOUNTER — Ambulatory Visit: Admitting: Physician Assistant

## 2025-02-23 ENCOUNTER — Ambulatory Visit
